# Patient Record
Sex: Male | Born: 1965 | Race: White | Hispanic: No | Smoking: Current every day smoker
Health system: Southern US, Community
[De-identification: ages and names within clinical notes are randomized; demographics above are authoritative.]

---

## 1998-08-13 ENCOUNTER — Emergency Department (HOSPITAL_COMMUNITY): Admission: EM | Admit: 1998-08-13 | Discharge: 1998-08-13 | Payer: Self-pay | Admitting: *Deleted

## 2000-10-30 ENCOUNTER — Ambulatory Visit (HOSPITAL_COMMUNITY): Admission: RE | Admit: 2000-10-30 | Discharge: 2000-10-30 | Payer: Self-pay | Admitting: Urology

## 2000-10-30 ENCOUNTER — Encounter: Payer: Self-pay | Admitting: Urology

## 2011-12-21 ENCOUNTER — Emergency Department (HOSPITAL_COMMUNITY)
Admission: EM | Admit: 2011-12-21 | Discharge: 2011-12-21 | Disposition: A | Discharge status: Home / Self Care | Payer: Self-pay | Attending: Emergency Medicine | Admitting: Emergency Medicine

## 2011-12-21 ENCOUNTER — Encounter (HOSPITAL_COMMUNITY): Payer: Self-pay | Admitting: *Deleted

## 2011-12-21 ENCOUNTER — Emergency Department (HOSPITAL_COMMUNITY): Payer: Self-pay

## 2011-12-21 DIAGNOSIS — M25569 Pain in unspecified knee: Secondary | ICD-10-CM | POA: Insufficient documentation

## 2011-12-21 DIAGNOSIS — M25519 Pain in unspecified shoulder: Secondary | ICD-10-CM | POA: Insufficient documentation

## 2011-12-21 DIAGNOSIS — F17200 Nicotine dependence, unspecified, uncomplicated: Secondary | ICD-10-CM | POA: Insufficient documentation

## 2011-12-21 DIAGNOSIS — S2239XA Fracture of one rib, unspecified side, initial encounter for closed fracture: Secondary | ICD-10-CM

## 2011-12-21 DIAGNOSIS — W11XXXA Fall on and from ladder, initial encounter: Secondary | ICD-10-CM | POA: Insufficient documentation

## 2011-12-21 DIAGNOSIS — R079 Chest pain, unspecified: Secondary | ICD-10-CM | POA: Insufficient documentation

## 2011-12-21 DIAGNOSIS — S2249XA Multiple fractures of ribs, unspecified side, initial encounter for closed fracture: Secondary | ICD-10-CM | POA: Insufficient documentation

## 2011-12-21 MED ORDER — OXYCODONE-ACETAMINOPHEN 5-325 MG PO TABS: 1.0000 | ORAL_TABLET | ORAL | Status: AC | PRN

## 2011-12-21 MED ORDER — MORPHINE SULFATE 10 MG/ML IJ SOLN
8.0000 mg | Freq: Once | INTRAMUSCULAR | Status: AC
  Administered 2011-12-21: 8 mg via INTRAVENOUS
  Filled 2011-12-21: qty 1

## 2011-12-21 MED ORDER — SODIUM CHLORIDE 0.9 % IV SOLN
Freq: Once | INTRAVENOUS | Status: AC
  Administered 2011-12-21: 12:00:00 via INTRAVENOUS

## 2011-12-21 MED ORDER — METHOCARBAMOL 500 MG PO TABS: ORAL_TABLET | ORAL | Status: DC

## 2011-12-21 MED ORDER — ONDANSETRON HCL 4 MG/2ML IJ SOLN
4.0000 mg | Freq: Once | INTRAMUSCULAR | Status: AC
  Administered 2011-12-21: 4 mg via INTRAVENOUS
  Filled 2011-12-21: qty 2

## 2011-12-21 NOTE — ED Notes (Signed)
Pt states that he fell off a ladder approximately 6 feet this am. States that he landed on a pile of bricks. C/o pain in his left shoulder, left ribs and left knee. Pt denies hitting his head or loss of consciousness.

## 2011-12-21 NOTE — ED Notes (Signed)
Cervical collar placed on patient

## 2012-01-14 NOTE — ED Provider Notes (Signed)
History     CSN: [id number]  Arrival date & time 12/21/11  1005   None     Chief Complaint  Patient presents with  . Fall    (Consider location/radiation/quality/duration/timing/severity/associated sxs/prior treatment) Patient is a 46 y.o. male presenting with fall. The history is provided by the patient.  Fall The accident occurred 3 to 5 hours ago. The fall occurred from a ladder. He fell from a height of 6 to 10 ft. Impact surface: Pile of bricks. The point of impact was the left shoulder. The pain is present in the left shoulder (Left ribs and knee). The pain is severe. He was ambulatory at the scene. Pertinent negatives include no visual change, no fever, no numbness, no abdominal pain, no vomiting, no hematuria and no loss of consciousness. The symptoms are aggravated by activity. He has tried nothing for the symptoms.    History reviewed. No pertinent past medical history.  History reviewed. No pertinent past surgical history.  History reviewed. No pertinent family history.  History  Substance Use Topics  . Smoking status: Current Everyday Smoker -- 1.0 packs/day    Types: Cigarettes  . Smokeless tobacco: Not on file  . Alcohol Use: Yes      Review of Systems  Constitutional: Negative for fever and activity change.       All ROS Neg except as noted in HPI  HENT: Negative for nosebleeds and neck pain.   Eyes: Negative for photophobia and discharge.  Respiratory: Negative for cough, shortness of breath and wheezing.        Chest wall pain  Cardiovascular: Negative for chest pain and palpitations.  Gastrointestinal: Negative for vomiting, abdominal pain and blood in stool.  Genitourinary: Negative for dysuria, frequency and hematuria.  Musculoskeletal: Negative for back pain and arthralgias.  Skin: Negative.   Neurological: Negative for dizziness, seizures, loss of consciousness, speech difficulty and numbness.  Psychiatric/Behavioral: Negative for hallucinations  and confusion.    Allergies  Review of patient's allergies indicates no known allergies.  Home Medications   Current Outpatient Rx  Name Route Sig Dispense Refill  . METHOCARBAMOL 500 MG PO TABS  2 po tid for spasm around ribs 30 tablet 0    BP 115/81  Pulse 72  Temp(Src) 97.5 F (36.4 C) (Oral)  Resp 18  Ht 6\' 2"  (1.88 m)  Wt 200 lb (90.719 kg)  BMI 25.68 kg/m2  SpO2 98%  Physical Exam  Nursing note and vitals reviewed. Constitutional: He is oriented to person, place, and time. He appears well-developed and well-nourished.  Non-toxic appearance.  HENT:  Head: Normocephalic.  Right Ear: Tympanic membrane and external ear normal.  Left Ear: Tympanic membrane and external ear normal.  Eyes: EOM and lids are normal. Pupils are equal, round, and reactive to light.  Neck: Normal range of motion. Neck supple. Carotid bruit is not present.  Cardiovascular: Normal rate, regular rhythm, normal heart sounds, intact distal pulses and normal pulses.   Pulmonary/Chest: Breath sounds normal. No respiratory distress.       Pain to palpation of the left ribs. No palpable deformity. No crepitus noted.  Abdominal: Soft. Bowel sounds are normal. There is no tenderness. There is no guarding.  Musculoskeletal: Normal range of motion.       Soreness with range of motion of the left knee. No deformity of the left hip. No deformity of the left lower leg. Distal pulses symmetrical.  Lymphadenopathy:       Head (right side): No  submandibular adenopathy present.       Head (left side): No submandibular adenopathy present.    He has no cervical adenopathy.  Neurological: He is alert and oriented to person, place, and time. He has normal strength. No cranial nerve deficit or sensory deficit.  Skin: Skin is warm and dry.  Psychiatric: He has a normal mood and affect. His speech is normal.    ED Course  Procedures (including critical care time)  Labs Reviewed - No data to display No results  found.   1. Rib fractures       MDM  I have reviewed nursing notes, vital signs, and all appropriate lab and imaging results for this patient.        Kathie Dike, [place] 01/14/12 2240

## 2012-01-15 NOTE — ED Provider Notes (Signed)
Medical screening examination/treatment/procedure(s) were performed by non-physician practitioner and as supervising physician I was immediately available for consultation/collaboration. Cassidie Veiga, MD, FACEP   Avynn Klassen L Aiza Vollrath, MD 01/15/12 2007 

## 2013-01-10 IMAGING — CR DG CERVICAL SPINE COMPLETE 4+V
6 series · 6 of 6 positions shown · non-contrast
Comparison: None.

CLINICAL DATA: Fall from ladder, left-sided pain

CERVICAL SPINE - COMPLETE 4+ VIEW

[view not recorded (1 of 6)]
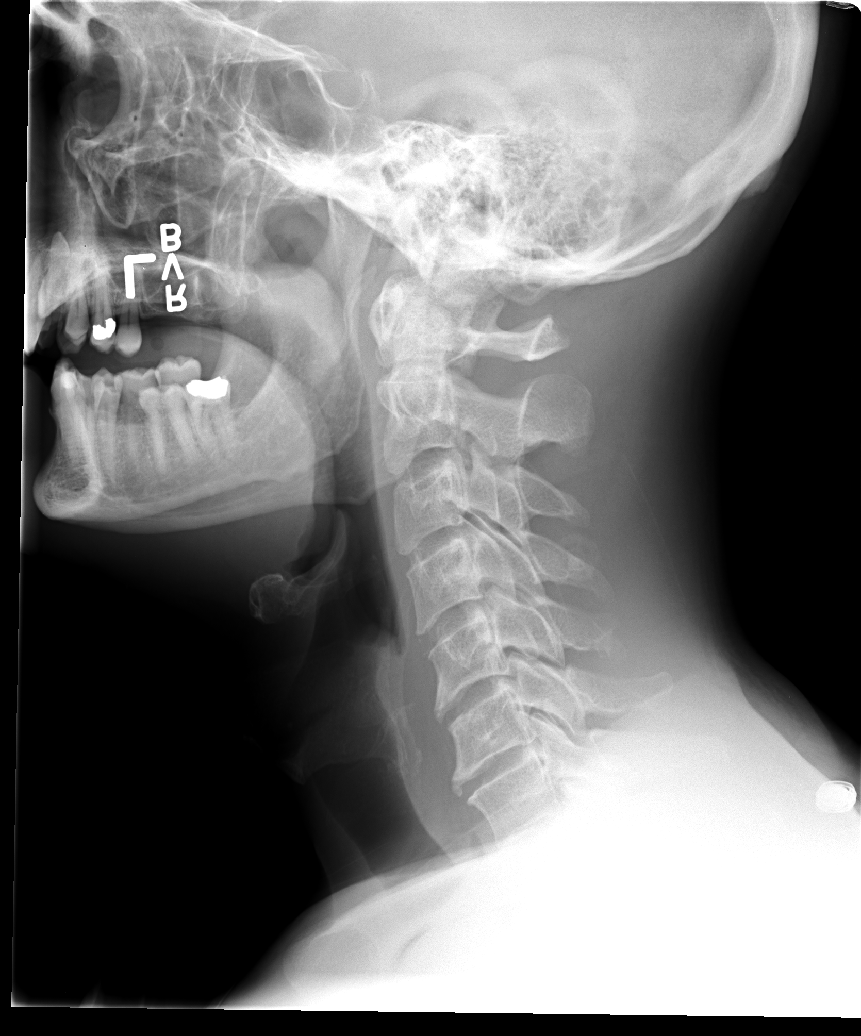

[view not recorded (2 of 6)]
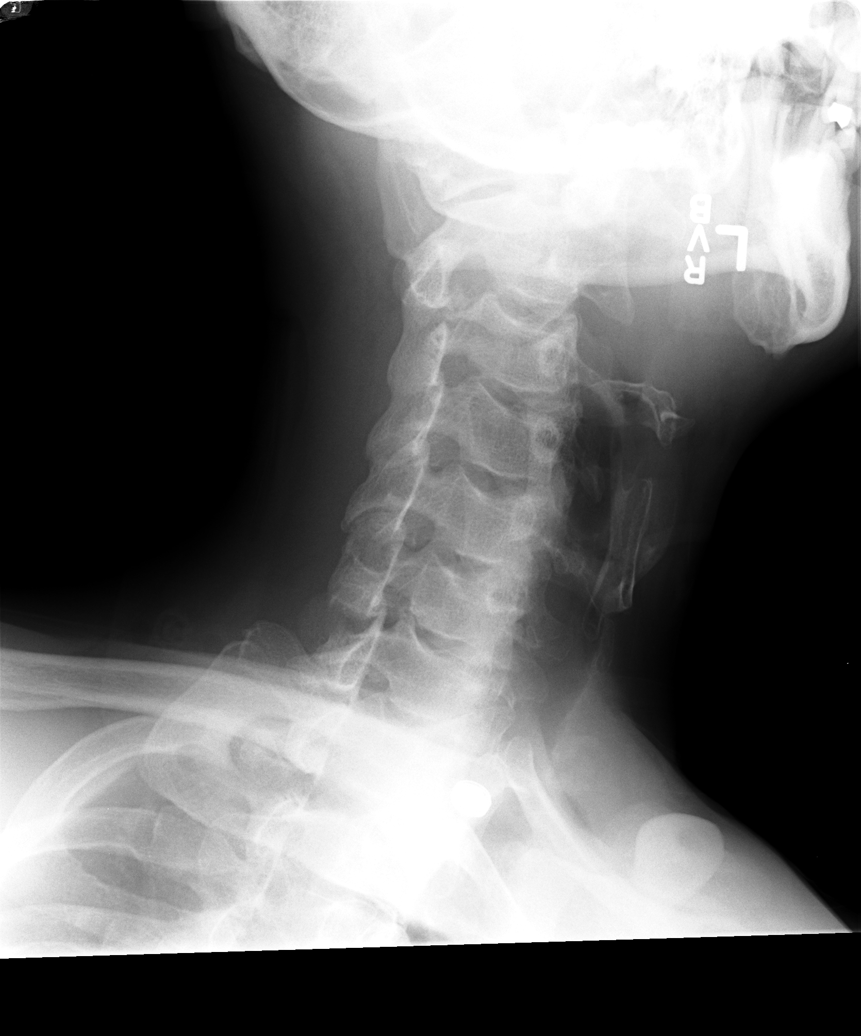

[view not recorded (3 of 6)]
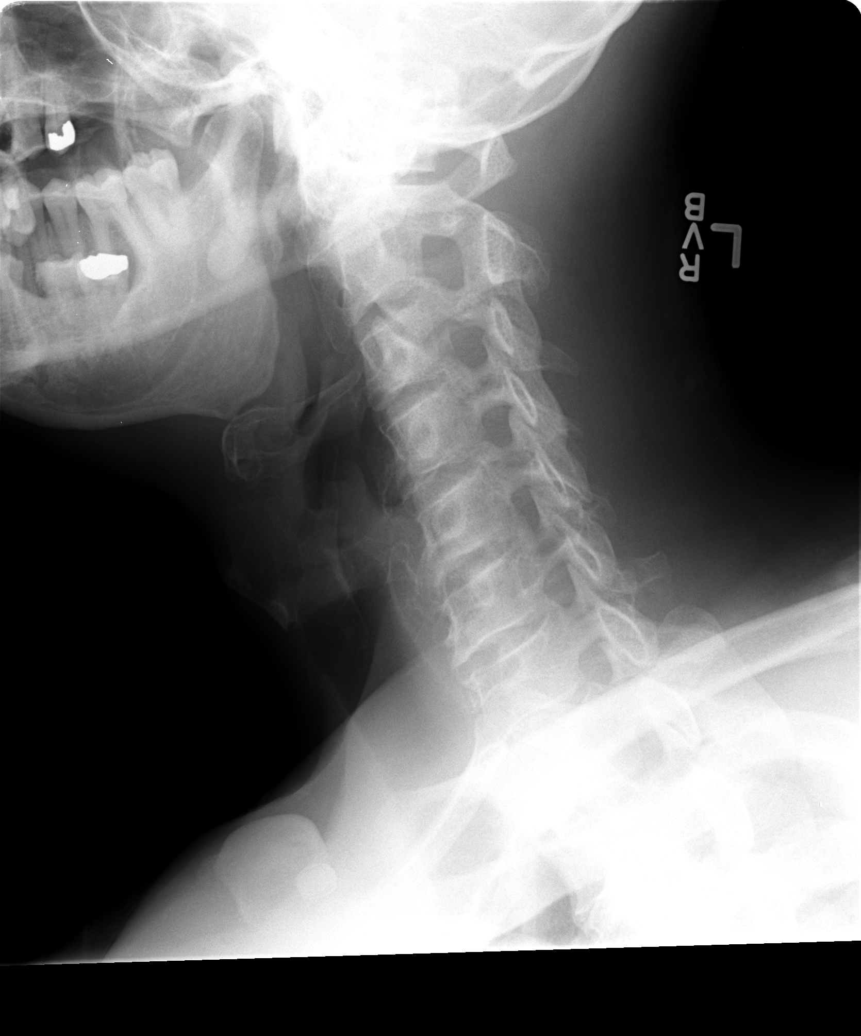

[view not recorded (4 of 6)]
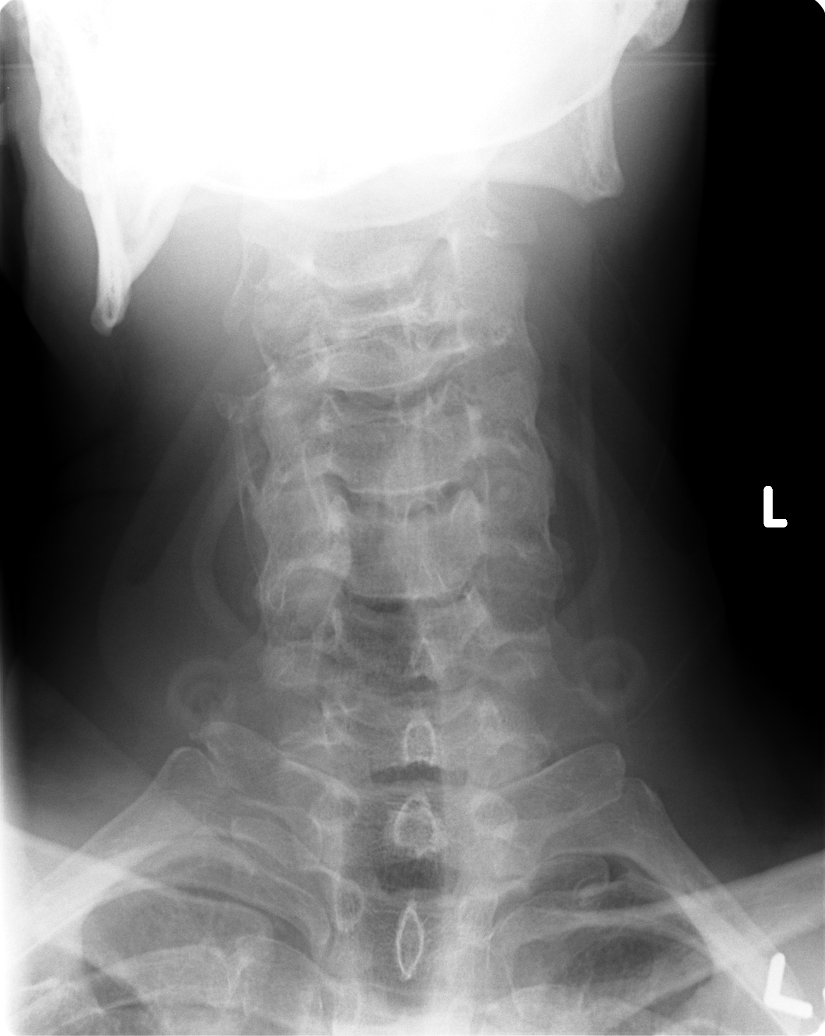

[view not recorded (5 of 6)]
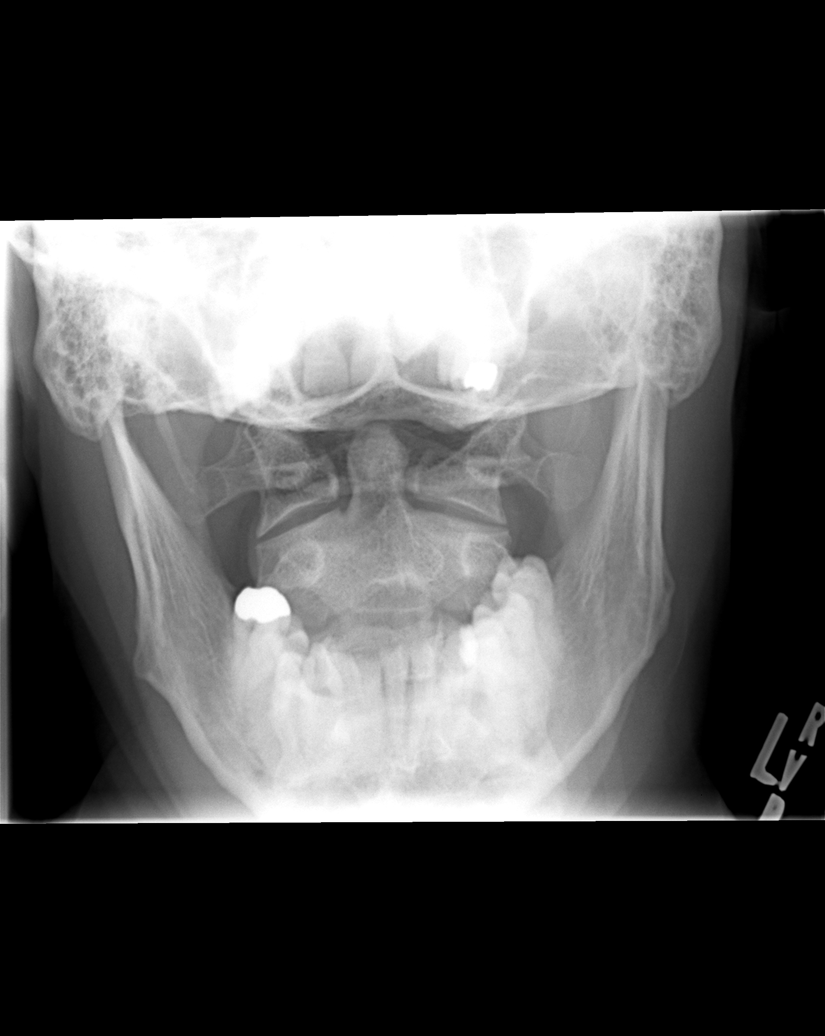

[view not recorded (6 of 6)]
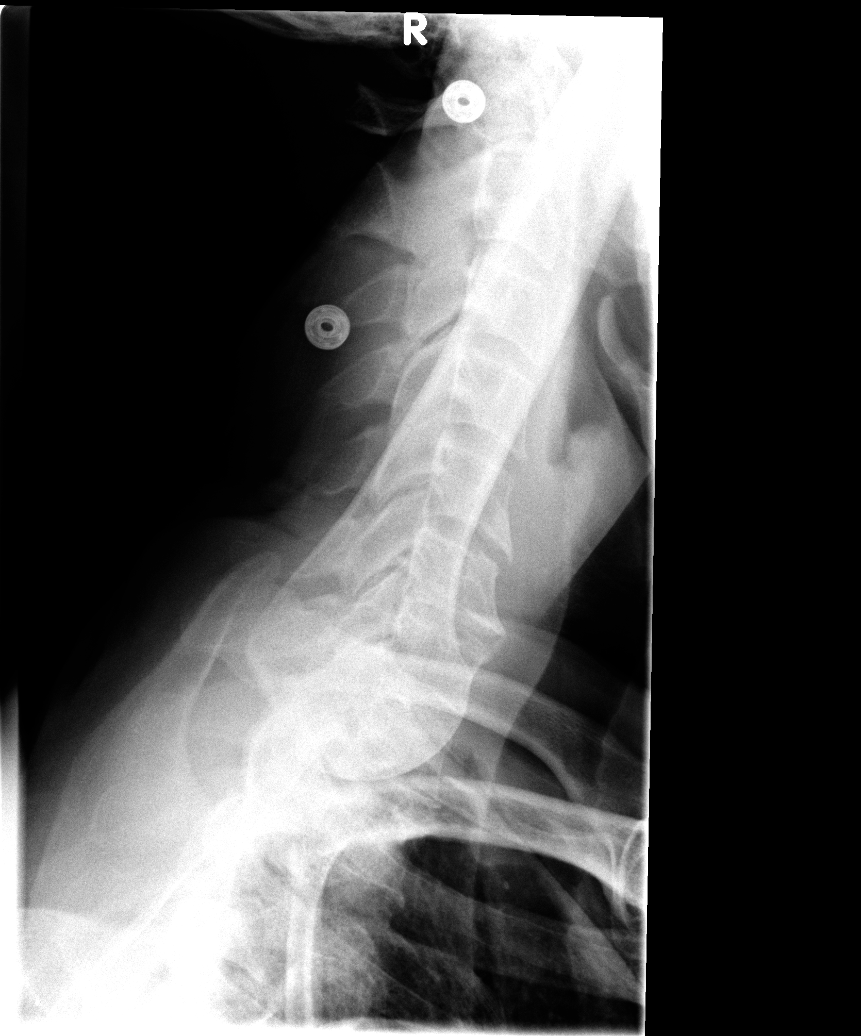

[6 of 6 positions shown; findings below may reference images not displayed]

FINDINGS: C1 through the cervical thoracic junction is visualized
in its entirety. No precervical soft tissue widening is present.
The dens is intact and well situated between the lateral masses.
Mild C6-7 anterior disc degenerative change noted.  Neural foramina
are patent bilaterally.
IMPRESSION: No acute finding.

## 2019-02-02 ENCOUNTER — Other Ambulatory Visit: Payer: Self-pay

## 2019-02-02 ENCOUNTER — Inpatient Hospital Stay (HOSPITAL_COMMUNITY)
Admission: EM | Admit: 2019-02-02 | Discharge: 2019-02-03 | DRG: 558 | Discharge status: Against Medical Advice | Payer: Self-pay | Attending: Internal Medicine | Admitting: Internal Medicine

## 2019-02-02 ENCOUNTER — Encounter (HOSPITAL_COMMUNITY): Payer: Self-pay | Admitting: Emergency Medicine

## 2019-02-02 DIAGNOSIS — F1590 Other stimulant use, unspecified, uncomplicated: Secondary | ICD-10-CM | POA: Diagnosis present

## 2019-02-02 DIAGNOSIS — M6282 Rhabdomyolysis: Principal | ICD-10-CM | POA: Diagnosis present

## 2019-02-02 DIAGNOSIS — F17210 Nicotine dependence, cigarettes, uncomplicated: Secondary | ICD-10-CM | POA: Diagnosis present

## 2019-02-02 DIAGNOSIS — F1910 Other psychoactive substance abuse, uncomplicated: Secondary | ICD-10-CM | POA: Diagnosis present

## 2019-02-02 DIAGNOSIS — R740 Nonspecific elevation of levels of transaminase and lactic acid dehydrogenase [LDH]: Secondary | ICD-10-CM | POA: Diagnosis present

## 2019-02-02 LAB — COMPREHENSIVE METABOLIC PANEL
ALK PHOS: 75 U/L (ref 38–126)
ALT: 87 U/L — AB (ref 0–44)
AST: 169 U/L — AB (ref 15–41)
Albumin: 4.2 g/dL (ref 3.5–5.0)
Anion gap: 9 (ref 5–15)
BUN: 21 mg/dL — AB (ref 6–20)
CHLORIDE: 101 mmol/L (ref 98–111)
CO2: 22 mmol/L (ref 22–32)
CREATININE: 0.9 mg/dL (ref 0.61–1.24)
Calcium: 8.6 mg/dL — ABNORMAL LOW (ref 8.9–10.3)
GFR calc Af Amer: >60 mL/min (ref >60)
Glucose, Bld: 96 mg/dL (ref 70–99)
Potassium: 3.5 mmol/L (ref 3.5–5.1)
SODIUM: 132 mmol/L — AB (ref 135–145)
Total Bilirubin: 1.4 mg/dL — ABNORMAL HIGH (ref 0.3–1.2)
Total Protein: 7.6 g/dL (ref 6.5–8.1)

## 2019-02-02 LAB — CBC WITH DIFFERENTIAL/PLATELET
ABS IMMATURE GRANULOCYTES: 0.01 10*3/uL (ref 0.00–0.07)
Basophils Absolute: 0 10*3/uL (ref 0.0–0.1)
Basophils Relative: 0 %
Eosinophils Absolute: 0.1 10*3/uL (ref 0.0–0.5)
Eosinophils Relative: 1 %
HEMATOCRIT: 45.3 % (ref 39.0–52.0)
HEMOGLOBIN: 14.3 g/dL (ref 13.0–17.0)
Immature Granulocytes: 0 %
LYMPHS PCT: 23 %
Lymphs Abs: 1.3 10*3/uL (ref 0.7–4.0)
MCH: 30.1 pg (ref 26.0–34.0)
MCHC: 31.6 g/dL (ref 30.0–36.0)
MCV: 95.4 fL (ref 80.0–100.0)
MONOS PCT: 11 %
Monocytes Absolute: 0.6 10*3/uL (ref 0.1–1.0)
NEUTROS ABS: 3.6 10*3/uL (ref 1.7–7.7)
Neutrophils Relative %: 65 %
Platelets: 243 10*3/uL (ref 150–400)
RBC: 4.75 MIL/uL (ref 4.22–5.81)
RDW: 13.5 % (ref 11.5–15.5)
WBC: 5.5 10*3/uL (ref 4.0–10.5)
nRBC: 0 % (ref 0.0–0.2)

## 2019-02-02 LAB — ETHANOL: Alcohol, Ethyl (B): <10 mg/dL (ref <10)

## 2019-02-02 LAB — CK: Total CK: 7786 U/L — ABNORMAL HIGH (ref 49–397)

## 2019-02-02 MED ORDER — SODIUM CHLORIDE 0.9 % IV BOLUS
1000.0000 mL | Freq: Once | INTRAVENOUS | Status: AC
Start: 2019-02-02 — End: 2019-02-03
  Administered 2019-02-02: 1000 mL via INTRAVENOUS

## 2019-02-02 MED ORDER — SODIUM CHLORIDE 0.9 % IV BOLUS
1000.0000 mL | Freq: Once | INTRAVENOUS | Status: AC
  Administered 2019-02-03: 1000 mL via INTRAVENOUS

## 2019-02-02 MED ORDER — CYCLOBENZAPRINE HCL 10 MG PO TABS
10.0000 mg | ORAL_TABLET | Freq: Once | ORAL | Status: AC
  Administered 2019-02-02: 10 mg via ORAL
  Filled 2019-02-02: qty 1

## 2019-02-02 NOTE — ED Notes (Signed)
EKG done and seen by Dr Jacqulyn Bath.

## 2019-02-02 NOTE — ED Provider Notes (Signed)
Emergency Department Provider Note   I have reviewed the triage vital signs and the nursing notes.   HISTORY  Chief Complaint Medical Clearance   HPI Anthony Callahan is a 53 y.o. male with PMH of polysubstance abuse presents to the emergency department for evaluation of detox from methamphetamine.  Patient states he uses frequently but not daily but has used over the past 2 years.  He occasionally drinks alcohol and uses marijuana.  He does not drink regularly or have history of complicated withdrawal.  He does not regularly use benzodiazepines.  He states that he is having diffuse muscle cramps especially behind his legs.  Last night he used meth and got a verbal altercation with someone.  He went running to the woods and got several scratches from plants and bushes.  Patient tried detox years ago but states he did not really work for him but is more committed at this time.    History reviewed. No pertinent past medical history.  Patient Active Problem List   Diagnosis Date Noted  . Rhabdomyolysis 02/03/2019    History reviewed. No pertinent surgical history.  Allergies Patient has no known allergies.  History reviewed. No pertinent family history.  Social History Social History   Tobacco Use  . Smoking status: Current Every Day Smoker    Packs/day: 1.00    Types: Cigarettes  Substance Use Topics  . Alcohol use: Yes    Comment: 6 pk/day  . Drug use: Yes    Types: Methamphetamines    Comment: snorts meth every couple days. last use was last night    Review of Systems  Constitutional: No fever/chills Eyes: No visual changes. ENT: No sore throat. Cardiovascular: Denies chest pain. Respiratory: Denies shortness of breath. Gastrointestinal: No abdominal pain.  No nausea, no vomiting.  No diarrhea.  No constipation. Genitourinary: Negative for dysuria. Musculoskeletal: Negative for back pain. Positive diffuse muscle cramps.  Skin: Negative for  rash. Neurological: Negative for headaches, focal weakness or numbness.  10-point ROS otherwise negative.  ____________________________________________   PHYSICAL EXAM:  VITAL SIGNS: ED Triage Vitals  Enc Vitals Group     BP 02/02/19 2125 (!) 136/101     Pulse Rate 02/02/19 2125 (!) 117     Resp 02/02/19 2125 (!) 22     Temp 02/02/19 2125 (!) 97.4 F (36.3 C)     Temp Source 02/02/19 2125 Oral     SpO2 02/02/19 2125 98 %     Weight 02/02/19 2125 180 lb (81.6 kg)     Height 02/02/19 2125 6\' 2"  (1.88 m)     Pain Score 02/02/19 2124 0   Constitutional: Alert and oriented. Well appearing and in no acute distress. Eyes: Conjunctivae are normal. PERRL. Head: Atraumatic. Nose: No congestion/rhinnorhea. Mouth/Throat: Mucous membranes are moist.  Neck: No stridor.  Cardiovascular: Normal rate, regular rhythm. Good peripheral circulation. Grossly normal heart sounds.   Respiratory: Normal respiratory effort.  No retractions. Lungs CTAB. Gastrointestinal: Soft and nontender. No distention.  Musculoskeletal: No lower extremity tenderness nor edema. No gross deformities of extremities. Neurologic:  Normal speech and language. No gross focal neurologic deficits are appreciated.  Skin:  Skin is warm and dry. Several small scratches over the arms/legs. No lacerations or ulcerations. No cellulitis or abscess.    ____________________________________________   LABS (all labs ordered are listed, but only abnormal results are displayed)  Labs Reviewed  COMPREHENSIVE METABOLIC PANEL - Abnormal; Notable for the following components:  Result Value   Sodium 132 (*)    BUN 21 (*)    Calcium 8.6 (*)    AST 169 (*)    ALT 87 (*)    Total Bilirubin 1.4 (*)    All other components within normal limits  CK - Abnormal; Notable for the following components:   Total CK 7,786 (*)    All other components within normal limits  COMPREHENSIVE METABOLIC PANEL - Abnormal; Notable for the following  components:   CO2 21 (*)    Calcium 8.1 (*)    Total Protein 6.2 (*)    Albumin 3.4 (*)    AST 137 (*)    ALT 72 (*)    All other components within normal limits  CK - Abnormal; Notable for the following components:   Total CK 4,719 (*)    All other components within normal limits  ETHANOL  CBC WITH DIFFERENTIAL/PLATELET  CBC  HIV ANTIBODY (ROUTINE TESTING W REFLEX)   ____________________________________________  EKG   EKG Interpretation  Date/Time:  Wednesday February 02 2019 22:31:42 EST Ventricular Rate:  95 PR Interval:    QRS Duration: 83 QT Interval:  348 QTC Calculation: 438 R Axis:   70 Text Interpretation:  Sinus rhythm No STEMI.  Confirmed by Alona Bene [phone]) on 02/02/2019 11:01:11 PM       ____________________________________________  RADIOLOGY  None  ____________________________________________   PROCEDURES  Procedure(s) performed:   Procedures  None  ____________________________________________   INITIAL IMPRESSION / ASSESSMENT AND PLAN / ED COURSE  Pertinent labs & imaging results that were available during my care of the patient were reviewed by me and considered in my medical decision making (see chart for details).  Patient presents to the emergency department requesting detox from methamphetamine.  He denies suicidal or homicidal ideation.  I discussed that we do not do inpatient detox for methamphetamine but I cannot provide him with resources.  He is also complaining of diffuse muscle cramping.  Given his drug use I suspect possible dehydration.  He is tachycardic here without fever.  No concern for sepsis.  Plan for IV fluids and Flexeril.  We will check baseline labs including CK.   CK elevated > 7,000. Plan for additional IVF. Discussed overnight admit with the patient for CK trending and IVF. Patient in agreement. Understands that not treating this appropriately could lead to renal failure which was discussed in detail.   Discussed  patient's case with Hospitalist, Dr. Sharl Ma to request admission. Patient and family (if present) updated with plan. Care transferred to Hospitalist service.  I reviewed all nursing notes, vitals, pertinent old records, EKGs, labs, imaging (as available).  ____________________________________________  FINAL CLINICAL IMPRESSION(S) / ED DIAGNOSES  Final diagnoses:  Non-traumatic rhabdomyolysis     MEDICATIONS GIVEN DURING THIS VISIT:  Medications  enoxaparin (LOVENOX) injection 40 mg (40 mg Subcutaneous Given 02/03/19 0246)  acetaminophen (TYLENOL) tablet 650 mg (has no administration in time range)    Or  acetaminophen (TYLENOL) suppository 650 mg (has no administration in time range)  ondansetron (ZOFRAN) tablet 4 mg (has no administration in time range)    Or  ondansetron (ZOFRAN) injection 4 mg (has no administration in time range)  0.9 %  sodium chloride infusion ( Intravenous Stopped 02/03/19 0753)  sodium chloride 0.9 % bolus 1,000 mL (0 mLs Intravenous Stopped 02/03/19 0001)  cyclobenzaprine (FLEXERIL) tablet 10 mg (10 mg Oral Given 02/02/19 2218)  sodium chloride 0.9 % bolus 1,000 mL (0 mLs Intravenous Stopped  02/03/19 0241)    Note:  This document was prepared using Dragon voice recognition software and may include unintentional dictation errors.  Alona Bene, MD Emergency Medicine    , Arlyss Repress, MD 02/03/19 [phone]

## 2019-02-02 NOTE — ED Triage Notes (Signed)
Pt states he wants detox from meth. Denies SI/HI just wants help to get off meth. Last use was last night.

## 2019-02-03 DIAGNOSIS — M6282 Rhabdomyolysis: Principal | ICD-10-CM

## 2019-02-03 LAB — COMPREHENSIVE METABOLIC PANEL
ALT: 72 U/L — ABNORMAL HIGH (ref 0–44)
AST: 137 U/L — AB (ref 15–41)
Albumin: 3.4 g/dL — ABNORMAL LOW (ref 3.5–5.0)
Alkaline Phosphatase: 60 U/L (ref 38–126)
Anion gap: 5 (ref 5–15)
BUN: 18 mg/dL (ref 6–20)
CO2: 21 mmol/L — ABNORMAL LOW (ref 22–32)
Calcium: 8.1 mg/dL — ABNORMAL LOW (ref 8.9–10.3)
Chloride: 111 mmol/L (ref 98–111)
Creatinine, Ser: 0.83 mg/dL (ref 0.61–1.24)
GFR calc Af Amer: >60 mL/min (ref >60)
GFR calc non Af Amer: >60 mL/min (ref >60)
Glucose, Bld: 99 mg/dL (ref 70–99)
Potassium: 3.6 mmol/L (ref 3.5–5.1)
Sodium: 137 mmol/L (ref 135–145)
Total Bilirubin: 1.1 mg/dL (ref 0.3–1.2)
Total Protein: 6.2 g/dL — ABNORMAL LOW (ref 6.5–8.1)

## 2019-02-03 LAB — CBC
HCT: 43.3 % (ref 39.0–52.0)
Hemoglobin: 13.7 g/dL (ref 13.0–17.0)
MCH: 31.2 pg (ref 26.0–34.0)
MCHC: 31.6 g/dL (ref 30.0–36.0)
MCV: 98.6 fL (ref 80.0–100.0)
Platelets: 201 10*3/uL (ref 150–400)
RBC: 4.39 MIL/uL (ref 4.22–5.81)
RDW: 13.6 % (ref 11.5–15.5)
WBC: 4.5 10*3/uL (ref 4.0–10.5)
nRBC: 0 % (ref 0.0–0.2)

## 2019-02-03 LAB — CK: Total CK: 4719 U/L — ABNORMAL HIGH (ref 49–397)

## 2019-02-03 MED ORDER — ENOXAPARIN SODIUM 40 MG/0.4ML ~~LOC~~ SOLN
40.0000 mg | SUBCUTANEOUS | Status: DC
  Administered 2019-02-03: 40 mg via SUBCUTANEOUS
  Filled 2019-02-03: qty 0.4

## 2019-02-03 MED ORDER — ONDANSETRON HCL 4 MG PO TABS: 4.0000 mg | ORAL_TABLET | Freq: Four times a day (QID) | ORAL | Status: DC | PRN

## 2019-02-03 MED ORDER — SODIUM CHLORIDE 0.9 % IV SOLN
INTRAVENOUS | Status: DC
  Administered 2019-02-03: 03:00:00 via INTRAVENOUS

## 2019-02-03 MED ORDER — ONDANSETRON HCL 4 MG/2ML IJ SOLN: 4.0000 mg | Freq: Four times a day (QID) | INTRAMUSCULAR | Status: DC | PRN

## 2019-02-03 MED ORDER — ACETAMINOPHEN 325 MG PO TABS: 650.0000 mg | ORAL_TABLET | Freq: Four times a day (QID) | ORAL | Status: DC | PRN

## 2019-02-03 MED ORDER — ACETAMINOPHEN 650 MG RE SUPP: 650.0000 mg | Freq: Four times a day (QID) | RECTAL | Status: DC | PRN

## 2019-02-03 MED ORDER — SODIUM CHLORIDE 0.9 % IV SOLN
INTRAVENOUS | Status: DC
  Administered 2019-02-03: 150 mL/h via INTRAVENOUS

## 2019-02-03 NOTE — Discharge Summary (Signed)
[facility] Discharge Summary   Patient: Anthony Callahan WCB:[id number]   PCP: Patient, No Pcp Per DOB: 20-Dec-1965   Date of admission: 02/02/2019   Date of discharge: 02/03/2019    Discharge Diagnoses:  Active Problems:   Rhabdomyolysis   Discharge Condition: LEFT AMA  History of present illness: As per the H and P dictated on admission, "[name]  is a 53 y.o. male, with history of polysubstance abuse, has been using methamphetamine almost every day came for evaluation for detox.  Patient said he has been using methamphetamine almost daily for past 2 years.  Occasionally drinks alcohol.  Patient has been experiencing muscle cramps for past few days.  Last night patient used meth and got a verbal altercation with someone.  He went running through the woods and got several scratches from plants and bushes. In the ED patient was found to have elevated CPK 7,786."  Hospital Course:  Presented with request for detox. Pt has rhabdomyolysis. Started on IV fluids and admitted to the hospital.  Pt informed RN that he has to go to work and left AMA. Pt left the building before I was notified.  patient left AMA  Procedures and Results:  none   Consultations:  none  The results of significant diagnostics from this hospitalization (including imaging, microbiology, ancillary and laboratory) are listed below for reference.    Significant Diagnostic Studies: No results found.  Microbiology: No results found for this or any previous visit (from the past 240 hour(s)).   Labs: CBC: Recent Labs  Lab 02/02/19 2230 02/03/19 0530  WBC 5.5 4.5  NEUTROABS 3.6  --   HGB 14.3 13.7  HCT 45.3 43.3  MCV 95.4 98.6  PLT 243 201   Basic Metabolic Panel: Recent Labs  Lab 02/02/19 2230 02/03/19 0530  NA 132* 137  K 3.5 3.6  CL 101 111  CO2 22 21*  GLUCOSE 96 99  BUN 21* 18  CREATININE 0.90 0.83  CALCIUM 8.6* 8.1*   Liver Function Tests: Recent Labs  Lab 02/02/19 2230  02/03/19 0530  AST 169* 137*  ALT 87* 72*  ALKPHOS 75 60  BILITOT 1.4* 1.1  PROT 7.6 6.2*  ALBUMIN 4.2 3.4*   No results for input(s): LIPASE, AMYLASE in the last 168 hours. No results for input(s): AMMONIA in the last 168 hours. Cardiac Enzymes: Recent Labs  Lab 02/02/19 2230 02/03/19 0530  CKTOTAL 7,786* 4,719*   BNP (last 3 results) No results for input(s): BNP in the last 8760 hours. CBG: No results for input(s): GLUCAP in the last 168 hours. Time spent: 15 minutes  Signed:  Lynden Oxford  [facility] 02/03/2019,

## 2019-02-03 NOTE — ED Notes (Signed)
Pt given sandwich upon request 

## 2019-02-03 NOTE — ED Notes (Signed)
Pt left AMA.  States he needed to go to work and could not miss a day.  Unwilling to stay and speak to the MD

## 2019-02-03 NOTE — H&P (Signed)
[facility] H&P    Patient Demographics:    Anthony Callahan, is a 53 y.o. male  MRN: [id number]  DOB - 13-Aug-1966  Admit Date - 02/02/2019  Referring MD/NP/PA: Alona Bene  Outpatient Primary MD for the patient is Patient, No Pcp Per  Patient coming from: Home  Chief complaint-body aches   HPI:    Anthony Callahan  is a 53 y.o. male, with history of polysubstance abuse, has been using methamphetamine almost every day came for evaluation for detox.  Patient said he has been using methamphetamine almost daily for past 2 years.  Occasionally drinks alcohol.  Patient has been experiencing muscle cramps for past few days.  Last night patient used meth and got a verbal altercation with someone.  He went running through the woods and got several scratches from plants and bushes. In the ED patient was found to have elevated CPK 7,786.  He denies nausea vomiting or diarrhea. Denies chest pain or shortness of breath Denies abdominal pain Denies dysuria. No previous history of stroke or seizures.   Review of systems:    In addition to the HPI above,    All other systems reviewed and are negative.    Past History of the following :    No significant past medical history.   Social History:      Social History   Tobacco Use  . Smoking status: Current Every Day Smoker    Packs/day: 1.00    Types: Cigarettes  Substance Use Topics  . Alcohol use: Yes    Comment: 6 pk/day       Family History :   No family history of cancer   Home Medications:   Prior to Admission medications   Medication Sig Start Date End Date Taking? Authorizing Provider  methocarbamol (ROBAXIN) 500 MG tablet 2 po tid for spasm around ribs 12/21/11   Ivery Quale, PA-C     Allergies:    No Known Allergies   Physical Exam:   Vitals  Blood pressure 120/82, pulse 85, temperature (!) 97.4 F (36.3 C), temperature source Oral,  resp. rate (!) 26, height 6\' 2"  (1.88 m), weight 81.6 kg, SpO2 100 %.  1.  General: Appears in no acute distress  2. Psychiatric: Alert, oriented x3, no focal deficit noted  3. Neurologic: Cranial nerves II through XII grossly intact, no focal deficit noted  4. HEENMT:  Atraumatic, normocephalic  5. Respiratory : Clear to auscultation bilaterally  6. Cardiovascular : S1-S2, regular, no murmur auscultated  7. Gastrointestinal:  Abdomen is soft, nontender, no organomegaly     Data Review:    CBC Recent Labs  Lab 02/02/19 2230  WBC 5.5  HGB 14.3  HCT 45.3  PLT 243  MCV 95.4  MCH 30.1  MCHC 31.6  RDW 13.5  LYMPHSABS 1.3  MONOABS 0.6  EOSABS 0.1  BASOSABS 0.0   ------------------------------------------------------------------------------------------------------------------  Results for orders placed or performed during the hospital encounter of 02/02/19 (from the past 48 hour(s))  Comprehensive metabolic panel     Status: Abnormal  Collection Time: 02/02/19 10:30 PM  Result Value Ref Range   Sodium 132 (L) 135 - 145 mmol/L   Potassium 3.5 3.5 - 5.1 mmol/L   Chloride 101 98 - 111 mmol/L   CO2 22 22 - 32 mmol/L   Glucose, Bld 96 70 - 99 mg/dL   BUN 21 (H) 6 - 20 mg/dL   Creatinine, Ser 5.00 0.61 - 1.24 mg/dL   Calcium 8.6 (L) 8.9 - 10.3 mg/dL   Total Protein 7.6 6.5 - 8.1 g/dL   Albumin 4.2 3.5 - 5.0 g/dL   AST 938 (H) 15 - 41 U/L   ALT 87 (H) 0 - 44 U/L   Alkaline Phosphatase 75 38 - 126 U/L   Total Bilirubin 1.4 (H) 0.3 - 1.2 mg/dL   GFR calc non Af Amer >60 >60 mL/min   GFR calc Af Amer >60 >60 mL/min   Anion gap 9 5 - 15    Comment: Performed at [facility], [address], [place], [place] [address]  Ethanol     Status: None   Collection Time: 02/02/19 10:30 PM  Result Value Ref Range   Alcohol, Ethyl (B) <10 <10 mg/dL    Comment: (NOTE) Lowest detectable limit for serum alcohol is 10 mg/dL. For medical purposes only. Performed at [facility], [address] [place], [place] [address]   CBC with Differential     Status: None   Collection Time: 02/02/19 10:30 PM  Result Value Ref Range   WBC 5.5 4.0 - 10.5 K/uL   RBC 4.75 4.22 - 5.81 MIL/uL   Hemoglobin 14.3 13.0 - 17.0 g/dL   HCT 67.8 93.8 - 10.1 %   MCV 95.4 80.0 - 100.0 fL   MCH 30.1 26.0 - 34.0 pg   MCHC 31.6 30.0 - 36.0 g/dL   RDW 75.1 02.5 - 85.2 %   Platelets 243 150 - 400 K/uL   nRBC 0.0 0.0 - 0.2 %   Neutrophils Relative % 65 %   Neutro Abs 3.6 1.7 - 7.7 K/uL   Lymphocytes Relative 23 %   Lymphs Abs 1.3 0.7 - 4.0 K/uL   Monocytes Relative 11 %   Monocytes Absolute 0.6 0.1 - 1.0 K/uL   Eosinophils Relative 1 %   Eosinophils Absolute 0.1 0.0 - 0.5 K/uL   Basophils Relative 0 %   Basophils Absolute 0.0 0.0 - 0.1 K/uL   Immature Granulocytes 0 %   Abs Immature Granulocytes 0.01 0.00 - 0.07 K/uL    Comment: Performed at [facility], [address] [place], [place] [address]  CK     Status: Abnormal   Collection Time: 02/02/19 10:30 PM  Result Value Ref Range   Total CK 7,786 (H) 49 - 397 U/L    Comment: REPEATED TO VERIFY RESULTS CONFIRMED BY MANUAL DILUTION Performed at [facility], [address], [place], [place] [address]     Chemistries  Recent Labs  Lab 02/02/19 2230  NA 132*  K 3.5  CL 101  CO2 22  GLUCOSE 96  BUN 21*  CREATININE 0.90  CALCIUM 8.6*  AST 169*  ALT 87*  ALKPHOS 75  BILITOT 1.4*   ------------------------------------------------------------------------------------------------------------------  ------------------------------------------------------------------------------------------------------------------ GFR: Estimated Creatinine Clearance: 113.3 mL/min (by C-G formula based on SCr of 0.9 mg/dL). Liver Function Tests: Recent Labs  Lab 02/02/19 2230  AST 169*  ALT 87*  ALKPHOS 75  BILITOT 1.4*  PROT 7.6  ALBUMIN 4.2   No results for input(s): LIPASE, AMYLASE in  the last 168 hours. No results for input(s):  AMMONIA in the last 168 hours. Coagulation Profile: No results for input(s): INR, PROTIME in the last 168 hours. Cardiac Enzymes: Recent Labs  Lab 02/02/19 2230  CKTOTAL 7,786*   BNP (last 3 results) No results for input(s): PROBNP in the last 8760 hours. HbA1C: No results for input(s): HGBA1C in the last 72 hours. CBG: No results for input(s): GLUCAP in the last 168 hours. Lipid Profile: No results for input(s): CHOL, HDL, LDLCALC, TRIG, CHOLHDL, LDLDIRECT in the last 72 hours. Thyroid Function Tests: No results for input(s): TSH, T4TOTAL, FREET4, T3FREE, THYROIDAB in the last 72 hours. Anemia Panel: No results for input(s): VITAMINB12, FOLATE, FERRITIN, TIBC, IRON, RETICCTPCT in the last 72 hours.  --------------------------------------------------------------------------------------------------------------- Urine analysis: No results found for: COLORURINE, APPEARANCEUR, LABSPEC, PHURINE, GLUCOSEU, HGBUR, BILIRUBINUR, KETONESUR, PROTEINUR, UROBILINOGEN, NITRITE, LEUKOCYTESUR    Imaging Results:    No results found.  My personal review of EKG: Rhythm NSR   Assessment & Plan:    Active Problems:   Rhabdomyolysis   1. Rhabdomyolysis-secondary to ingestion of methamphetamine, patient received 2 L of IV normal saline in the ED.  We will continue with normal saline at 150 mL/h.  Check CK level in a.m.  2. Transaminitis-patient has mild elevation of AST and ALT, likely from rhabdomyolysis.  Follow liver enzymes in a.m.  3. 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  Will consult social work for outpatient substance abuse program information.   DVT Prophylaxis-   Lovenox   AM Labs Ordered, also please review Full Orders  Family Communication: Admission, patients condition and plan of care including tests being ordered have been discussed with the patient  who indicate understanding and agree with the plan and Code Status.  Code Status: Full  code  Admission status: Inpatient: Based on patients clinical presentation and evaluation of above clinical data, I have made determination that patient meets Inpatient criteria at this time.  Time spent in minutes : 60 minutes   Meredeth Ide M.D on 02/03/2019 at 2:26 AM

## 2019-02-04 ENCOUNTER — Encounter (HOSPITAL_COMMUNITY): Payer: Self-pay | Admitting: *Deleted

## 2019-02-04 ENCOUNTER — Other Ambulatory Visit: Payer: Self-pay

## 2019-02-04 ENCOUNTER — Inpatient Hospital Stay (HOSPITAL_COMMUNITY)
Admission: EM | Admit: 2019-02-04 | Discharge: 2019-02-07 | DRG: 558 | Disposition: A | Discharge status: Home / Self Care | Payer: Self-pay | Attending: Family Medicine | Admitting: Family Medicine

## 2019-02-04 DIAGNOSIS — R300 Dysuria: Secondary | ICD-10-CM | POA: Diagnosis present

## 2019-02-04 DIAGNOSIS — F17210 Nicotine dependence, cigarettes, uncomplicated: Secondary | ICD-10-CM | POA: Diagnosis present

## 2019-02-04 DIAGNOSIS — F1520 Other stimulant dependence, uncomplicated: Secondary | ICD-10-CM | POA: Diagnosis present

## 2019-02-04 DIAGNOSIS — M6282 Rhabdomyolysis: Principal | ICD-10-CM | POA: Diagnosis present

## 2019-02-04 DIAGNOSIS — R740 Nonspecific elevation of levels of transaminase and lactic acid dehydrogenase [LDH]: Secondary | ICD-10-CM | POA: Diagnosis present

## 2019-02-04 DIAGNOSIS — F1510 Other stimulant abuse, uncomplicated: Secondary | ICD-10-CM | POA: Diagnosis present

## 2019-02-04 DIAGNOSIS — F1910 Other psychoactive substance abuse, uncomplicated: Secondary | ICD-10-CM | POA: Diagnosis present

## 2019-02-04 NOTE — ED Triage Notes (Signed)
Pt c/o burning with urination and abdominal pain; pt states he was just seen here x 3 days ago for meth addiction; pt informed EMS he was homeless and didn't have any place to go

## 2019-02-04 NOTE — ED Provider Notes (Signed)
[facility] EMERGENCY DEPARTMENT Provider Note   CSN: [id number] Arrival date & time: 02/04/19  2314    History   Chief Complaint Chief Complaint  Patient presents with  . Dysuria    HPI Anthony Callahan is a 53 y.o. male.     Patient is a 53 year old male who presents to the emergency department with a complaint of burning with urination and abdominal pain.  The patient states that he was just here 2 to 3 days ago for assistance with a methamphetamine addiction.  He was evaluated and he was told that he had some abnormalities on his test that could affect his kidney.  He was admitted, but signed himself out AGAINST MEDICAL ADVICE because he said he had to maintain his job.  The patient stated he had to leave his job because of the burning sensation and abdominal pain.  He also states that his muscles "felt funny" and he was noticing muscle cramps in various areas.  He denies fever.  He complains of fatigue.  There is been no vomiting or diarrhea reported.  The patient does state that he use some crystal meth this morning.  Patient states that he drinks occasionally but has not been drinking recently.  He request evaluation not only of the dysuria, but also of the muscle cramps and body feeling funny.  The history is provided by the patient.  Dysuria  Presenting symptoms: dysuria   Presenting symptoms: no penile discharge   Context: not after injury   Relieved by:  Nothing Worsened by:  Urination Associated symptoms: abdominal pain   Associated symptoms comment:  "my body feels weird" MOuth feels dry. I did some METH earlier today.   History reviewed. No pertinent past medical history.  Patient Active Problem List   Diagnosis Date Noted  . Rhabdomyolysis 02/03/2019    History reviewed. No pertinent surgical history.      Home Medications    Prior to Admission medications   Medication Sig Start Date End Date Taking? Authorizing Provider  methocarbamol (ROBAXIN) 500 MG  tablet 2 po tid for spasm around ribs 12/21/11   Ivery Quale, PA-C    Family History History reviewed. No pertinent family history.  Social History Social History   Tobacco Use  . Smoking status: Current Every Day Smoker    Packs/day: 1.00    Types: Cigarettes  . Smokeless tobacco: Never Used  Substance Use Topics  . Alcohol use: Yes    Comment: 6 pk/day  . Drug use: Yes    Types: Methamphetamines    Comment: snorts meth every couple days. last use was last night     Allergies   Patient has no known allergies.   Review of Systems Review of Systems  Gastrointestinal: Positive for abdominal pain.  Genitourinary: Positive for dysuria. Negative for discharge.  Musculoskeletal: Positive for arthralgias and myalgias.       Muscle soreness and cramping.     Physical Exam Updated Vital Signs BP (!) 141/96 (BP Location: Left Arm)   Pulse 85   Temp 97.9 F (36.6 C) (Oral)   Resp 18   Ht 6\' 2"  (1.88 m)   Wt 81.6 kg   SpO2 100%   BMI 23.11 kg/m   Physical Exam Vitals signs and nursing note reviewed.  Constitutional:      Appearance: He is well-developed. He is not toxic-appearing.  HENT:     Head: Normocephalic.     Right Ear: Tympanic membrane and external ear normal.  Left Ear: Tympanic membrane and external ear normal.  Eyes:     General: Lids are normal.     Pupils: Pupils are equal, round, and reactive to light.  Neck:     Musculoskeletal: Normal range of motion and neck supple.     Vascular: No carotid bruit.  Cardiovascular:     Rate and Rhythm: Normal rate and regular rhythm.     Pulses: Normal pulses.     Heart sounds: Normal heart sounds.  Pulmonary:     Effort: No respiratory distress.     Breath sounds: Normal breath sounds.  Abdominal:     General: Bowel sounds are normal.     Palpations: Abdomen is soft.     Tenderness: There is no abdominal tenderness. There is no guarding.  Musculoskeletal: Normal range of motion.     Comments:  Diffuse soreness of multiple sites.  The patient has full range of motion of upper and lower extremities.  Radial pulses are 2+.  Dorsalis pedis pulses are 2+.  There are no temperature changes of the upper or lower extremities.  Lymphadenopathy:     Head:     Right side of head: No submandibular adenopathy.     Left side of head: No submandibular adenopathy.     Cervical: No cervical adenopathy.  Skin:    General: Skin is warm and dry.  Neurological:     Mental Status: He is alert and oriented to person, place, and time.     Cranial Nerves: No cranial nerve deficit.     Sensory: No sensory deficit.  Psychiatric:        Speech: Speech normal.        Thought Content: Thought content does not include homicidal or suicidal ideation. Thought content does not include homicidal or suicidal plan.      ED Treatments / Results  Labs (all labs ordered are listed, but only abnormal results are displayed) Labs Reviewed - No data to display  EKG None  Radiology No results found.  Procedures Procedures (including critical care time)  Medications Ordered in ED Medications - No data to display   Initial Impression / Assessment and Plan / ED Course  I have reviewed the triage vital signs and the nursing notes.  Pertinent labs & imaging results that were available during my care of the patient were reviewed by me and considered in my medical decision making (see chart for details).          Final Clinical Impressions(s) / ED Diagnoses MDM  Vital signs reviewed.  Blood pressure slightly elevated at 141/96, otherwise vital signs within normal limits.  Pulse oximetry is 100% on room air.  Within normal limits by my interpretation.  Patient presents with dysuria, abdominal discomfort, diffuse muscle pain, and body feeling funny.  Patient was evaluated and admitted to the hospital because of what appeared to be a rhabdomyolysis.  The patient uses crystal meth.  He says he also uses  alcohol and marijuana from time to time but has not been using recently.  The patient states that he did use some crystal meth this morning.  His symptoms have become increasingly worse and so he came back to the emergency department for evaluation.  Urine analysis reveals a straw-colored specimen with a specific gravity of 1.004.  There is moderate blood on the dipstick, otherwise the urine analysis is within normal limits.  The electrocardiogram shows normal sinus rhythm.  There is no high degree blocks, and no  evidence of an acute STEMI.  Labs and Xray pending. Pt's care to be continued by Dr Preston Fleeting.   Final diagnoses:  Non-traumatic rhabdomyolysis    ED Discharge Orders    None       Ivery Quale, PA-C 02/05/19 1120    Dione Booze, MD 02/05/19 2238

## 2019-02-05 ENCOUNTER — Encounter (HOSPITAL_COMMUNITY): Payer: Self-pay | Admitting: *Deleted

## 2019-02-05 ENCOUNTER — Other Ambulatory Visit: Payer: Self-pay

## 2019-02-05 ENCOUNTER — Emergency Department (HOSPITAL_COMMUNITY): Payer: Self-pay

## 2019-02-05 DIAGNOSIS — M6282 Rhabdomyolysis: Principal | ICD-10-CM

## 2019-02-05 LAB — CBC WITH DIFFERENTIAL/PLATELET
Abs Immature Granulocytes: 0.01 10*3/uL (ref 0.00–0.07)
Basophils Absolute: 0 10*3/uL (ref 0.0–0.1)
Basophils Relative: 0 %
EOS ABS: 0 10*3/uL (ref 0.0–0.5)
EOS PCT: 1 %
HCT: 43 % (ref 39.0–52.0)
Hemoglobin: 14.1 g/dL (ref 13.0–17.0)
Immature Granulocytes: 0 %
Lymphocytes Relative: 31 %
Lymphs Abs: 1.6 10*3/uL (ref 0.7–4.0)
MCH: 32 pg (ref 26.0–34.0)
MCHC: 32.8 g/dL (ref 30.0–36.0)
MCV: 97.7 fL (ref 80.0–100.0)
Monocytes Absolute: 0.4 10*3/uL (ref 0.1–1.0)
Monocytes Relative: 8 %
Neutro Abs: 3.1 10*3/uL (ref 1.7–7.7)
Neutrophils Relative %: 60 %
PLATELETS: 225 10*3/uL (ref 150–400)
RBC: 4.4 MIL/uL (ref 4.22–5.81)
RDW: 13.4 % (ref 11.5–15.5)
WBC: 5.2 10*3/uL (ref 4.0–10.5)
nRBC: 0 % (ref 0.0–0.2)

## 2019-02-05 LAB — COMPREHENSIVE METABOLIC PANEL
ALT: 103 U/L — ABNORMAL HIGH (ref 0–44)
AST: 241 U/L — ABNORMAL HIGH (ref 15–41)
Albumin: 4 g/dL (ref 3.5–5.0)
Alkaline Phosphatase: 61 U/L (ref 38–126)
Anion gap: 1 — ABNORMAL LOW (ref 5–15)
BUN: 11 mg/dL (ref 6–20)
CALCIUM: 8.5 mg/dL — AB (ref 8.9–10.3)
CO2: 30 mmol/L (ref 22–32)
Chloride: 104 mmol/L (ref 98–111)
Creatinine, Ser: 0.73 mg/dL (ref 0.61–1.24)
Glucose, Bld: 89 mg/dL (ref 70–99)
Potassium: 3.5 mmol/L (ref 3.5–5.1)
Sodium: 135 mmol/L (ref 135–145)
Total Bilirubin: 0.8 mg/dL (ref 0.3–1.2)
Total Protein: 7.1 g/dL (ref 6.5–8.1)

## 2019-02-05 LAB — AMMONIA: Ammonia: 19 umol/L (ref 9–35)

## 2019-02-05 LAB — URINALYSIS, ROUTINE W REFLEX MICROSCOPIC
Bacteria, UA: NONE SEEN
Bilirubin Urine: NEGATIVE
Glucose, UA: NEGATIVE mg/dL
Ketones, ur: NEGATIVE mg/dL
Leukocytes,Ua: NEGATIVE
Nitrite: NEGATIVE
Protein, ur: NEGATIVE mg/dL
SPECIFIC GRAVITY, URINE: 1.004 — AB (ref 1.005–1.030)
pH: 6 (ref 5.0–8.0)

## 2019-02-05 LAB — CK
Total CK: 11251 U/L — ABNORMAL HIGH (ref 49–397)
Total CK: 6814 U/L — ABNORMAL HIGH (ref 49–397)

## 2019-02-05 LAB — LIPASE, BLOOD: Lipase: 34 U/L (ref 11–51)

## 2019-02-05 MED ORDER — NICOTINE 14 MG/24HR TD PT24
14.0000 mg | MEDICATED_PATCH | Freq: Once | TRANSDERMAL | Status: AC
  Administered 2019-02-05: 14 mg via TRANSDERMAL
  Filled 2019-02-05: qty 1

## 2019-02-05 MED ORDER — ONDANSETRON HCL 4 MG PO TABS: 4.0000 mg | ORAL_TABLET | Freq: Four times a day (QID) | ORAL | Status: DC | PRN

## 2019-02-05 MED ORDER — METHOCARBAMOL 500 MG PO TABS: 500.0000 mg | ORAL_TABLET | Freq: Four times a day (QID) | ORAL | Status: DC | PRN

## 2019-02-05 MED ORDER — ENOXAPARIN SODIUM 40 MG/0.4ML ~~LOC~~ SOLN
40.0000 mg | SUBCUTANEOUS | Status: DC
  Administered 2019-02-05 – 2019-02-07 (×3): 40 mg via SUBCUTANEOUS
  Filled 2019-02-05 (×3): qty 0.4

## 2019-02-05 MED ORDER — SODIUM CHLORIDE 0.9 % IV BOLUS
1000.0000 mL | Freq: Once | INTRAVENOUS | Status: AC
  Administered 2019-02-05: 1000 mL via INTRAVENOUS

## 2019-02-05 MED ORDER — ONDANSETRON HCL 4 MG/2ML IJ SOLN: 4.0000 mg | Freq: Four times a day (QID) | INTRAMUSCULAR | Status: DC | PRN

## 2019-02-05 MED ORDER — SODIUM CHLORIDE 0.9 % IV SOLN
INTRAVENOUS | Status: DC
  Administered 2019-02-05 – 2019-02-07 (×8): via INTRAVENOUS

## 2019-02-05 NOTE — ED Notes (Signed)
Patient's IVF paused and iv secured and covered for patient to take shower.

## 2019-02-05 NOTE — ED Notes (Addendum)
Pt given meal

## 2019-02-05 NOTE — H&P (Signed)
TRH H&P    Patient Demographics:    Anthony Callahan, is a 53 y.o. male  MRN: [id number]  DOB - 07/01/1966  Admit Date - 02/04/2019  Referring MD/NP/PA: Dr. Preston FleetingGlick  Outpatient Primary MD for the patient is Patient, No Pcp Per  Patient coming from: Home  Chief complaint-muscle aches   HPI:    Anthony DullJeffrey Anfinson  is a 53 y.o. male, with history of polysubstance abuse, methamphetamine use who was recently admitted to the hospital for detox and rhabdomyolysis.  Patient left AMA at that time as he had to go back to his job as per patient.  Patient says that his symptoms got worse he has worsening weakness of lower extremities especially hamstrings. Lab work in the ED showed worsening of CK up to 11,251, it was 7786 on 02/03/2019. Denies chest pain or shortness of breath. Complains of nausea but no vomiting Complains of intermittent burning in urination     Review of systems:    In addition to the HPI above,   All other systems reviewed and are negative.    Past History of the following :      Social History:      Social History   Tobacco Use  . Smoking status: Current Every Day Smoker    Packs/day: 1.00    Types: Cigarettes  . Smokeless tobacco: Never Used  Substance Use Topics  . Alcohol use: Yes    Comment: 6 pk/day       Family History :   No family history of cancer   Home Medications:   Prior to Admission medications   Medication Sig Start Date End Date Taking? Authorizing Provider  methocarbamol (ROBAXIN) 500 MG tablet 2 po tid for spasm around ribs 12/21/11   Ivery QualeBryant, [place], PA-C     Allergies:    No Known Allergies   Physical Exam:   Vitals  Blood pressure (!) 141/96, pulse 85, temperature 97.9 F (36.6 C), temperature source Oral, resp. rate 18, height 6\' 2"  (1.88 m), weight 81.6 kg, SpO2 100 %.  1.  General: Appears in no acute distress  2. Psychiatric: Alert,  oriented x3, no focal deficit noted  3. Neurologic: Cranial nerve II through grossly intact, motor strength 5/5 in all extremities  4. HEENMT:  Atraumatic normocephalic, extraocular muscles intact  5. Respiratory : Clear to auscultation bilaterally, no wheezing or crackles  6. Cardiovascular : S1-S2, regular, no murmur auscultated  7. Gastrointestinal:  Abdomen soft, nontender, no organomegaly  8. Skin:  No rashes noted  9.Musculoskeletal:  Mild tenderness in muscles of lower extremities    Data Review:    CBC Recent Labs  Lab 02/02/19 2230 02/03/19 0530 02/05/19 0055  WBC 5.5 4.5 5.2  HGB 14.3 13.7 14.1  HCT 45.3 43.3 43.0  PLT 243 201 225  MCV 95.4 98.6 97.7  MCH 30.1 31.2 32.0  MCHC 31.6 31.6 32.8  RDW 13.5 13.6 13.4  LYMPHSABS 1.3  --  1.6  MONOABS 0.6  --  0.4  EOSABS 0.1  --  0.0  BASOSABS 0.0  --  0.0   ------------------------------------------------------------------------------------------------------------------  Results for orders placed or performed during the hospital encounter of 02/04/19 (from the past 48 hour(s))  Urinalysis, Routine w reflex microscopic     Status: Abnormal   Collection Time: 02/05/19 12:15 AM  Result Value Ref Range   Color, Urine STRAW (A) YELLOW   APPearance CLEAR CLEAR   Specific Gravity, Urine 1.004 (L) 1.005 - 1.030   pH 6.0 5.0 - 8.0   Glucose, UA NEGATIVE NEGATIVE mg/dL   Hgb urine dipstick MODERATE (A) NEGATIVE   Bilirubin Urine NEGATIVE NEGATIVE   Ketones, ur NEGATIVE NEGATIVE mg/dL   Protein, ur NEGATIVE NEGATIVE mg/dL   Nitrite NEGATIVE NEGATIVE   Leukocytes,Ua NEGATIVE NEGATIVE   RBC / HPF 0-5 0 - 5 RBC/hpf   WBC, UA 0-5 0 - 5 WBC/hpf   Bacteria, UA NONE SEEN NONE SEEN    Comment: Performed at [facility], [address], [place], [place] [address]  Comprehensive metabolic panel     Status: Abnormal   Collection Time: 02/05/19 12:55 AM  Result Value Ref Range   Sodium 135 135 - 145 mmol/L   Potassium  3.5 3.5 - 5.1 mmol/L   Chloride 104 98 - 111 mmol/L   CO2 30 22 - 32 mmol/L   Glucose, Bld 89 70 - 99 mg/dL   BUN 11 6 - 20 mg/dL   Creatinine, Ser 3.01 0.61 - 1.24 mg/dL   Calcium 8.5 (L) 8.9 - 10.3 mg/dL   Total Protein 7.1 6.5 - 8.1 g/dL   Albumin 4.0 3.5 - 5.0 g/dL   AST 601 (H) 15 - 41 U/L   ALT 103 (H) 0 - 44 U/L   Alkaline Phosphatase 61 38 - 126 U/L   Total Bilirubin 0.8 0.3 - 1.2 mg/dL   GFR calc non Af Amer >60 >60 mL/min   GFR calc Af Amer >60 >60 mL/min   Anion gap 1 (L) 5 - 15    Comment: Performed at [facility], [address] [place], [place] [address]  Lipase, blood     Status: None   Collection Time: 02/05/19 12:55 AM  Result Value Ref Range   Lipase 34 11 - 51 U/L    Comment: Performed at [facility], [address], [place], [place] [address]  CBC with Differential     Status: None   Collection Time: 02/05/19 12:55 AM  Result Value Ref Range   WBC 5.2 4.0 - 10.5 K/uL   RBC 4.40 4.22 - 5.81 MIL/uL   Hemoglobin 14.1 13.0 - 17.0 g/dL   HCT 20.2 54.2 - 70.6 %   MCV 97.7 80.0 - 100.0 fL   MCH 32.0 26.0 - 34.0 pg   MCHC 32.8 30.0 - 36.0 g/dL   RDW 23.7 62.8 - 31.5 %   Platelets 225 150 - 400 K/uL   nRBC 0.0 0.0 - 0.2 %   Neutrophils Relative % 60 %   Neutro Abs 3.1 1.7 - 7.7 K/uL   Lymphocytes Relative 31 %   Lymphs Abs 1.6 0.7 - 4.0 K/uL   Monocytes Relative 8 %   Monocytes Absolute 0.4 0.1 - 1.0 K/uL   Eosinophils Relative 1 %   Eosinophils Absolute 0.0 0.0 - 0.5 K/uL   Basophils Relative 0 %   Basophils Absolute 0.0 0.0 - 0.1 K/uL   Immature Granulocytes 0 %   Abs Immature Granulocytes 0.01 0.00 - 0.07 K/uL    Comment: Performed at [facility],  [address] [place], [place] [address]  CK     Status: Abnormal   Collection Time: 02/05/19 12:55 AM  Result Value Ref Range   Total CK 11,251 (H) 49 - 397 U/L    Comment: RESULTS CONFIRMED BY MANUAL DILUTION Performed at [facility], [address], [place], [place] [address]   Ammonia     Status:  None   Collection Time: 02/05/19 12:56 AM  Result Value Ref Range   Ammonia 19 9 - 35 umol/L    Comment: Performed at [facility], [address] [place], [place] [address]    Chemistries  Recent Labs  Lab 02/02/19 2230 02/03/19 0530 02/05/19 0055  NA 132* 137 135  K 3.5 3.6 3.5  CL 101 111 104  CO2 22 21* 30  GLUCOSE 96 99 89  BUN 21* 18 11  CREATININE 0.90 0.83 0.73  CALCIUM 8.6* 8.1* 8.5*  AST 169* 137* 241*  ALT 87* 72* 103*  ALKPHOS 75 60 61  BILITOT 1.4* 1.1 0.8   ------------------------------------------------------------------------------------------------------------------  ------------------------------------------------------------------------------------------------------------------ GFR: Estimated Creatinine Clearance: 127.5 mL/min (by C-G formula based on SCr of 0.73 mg/dL). Liver Function Tests: Recent Labs  Lab 02/02/19 2230 02/03/19 0530 02/05/19 0055  AST 169* 137* 241*  ALT 87* 72* 103*  ALKPHOS 75 60 61  BILITOT 1.4* 1.1 0.8  PROT 7.6 6.2* 7.1  ALBUMIN 4.2 3.4* 4.0   Recent Labs  Lab 02/05/19 0055  LIPASE 34   Recent Labs  Lab 02/05/19 0056  AMMONIA 19   Coagulation Profile: No results for input(s): INR, PROTIME in the last 168 hours. Cardiac Enzymes: Recent Labs  Lab 02/02/19 2230 02/03/19 0530 02/05/19 0055  CKTOTAL 7,786* 4,719* 11,251*   BNP (last 3 results) No results for input(s): PROBNP in the last 8760 hours. HbA1C: No results for input(s): HGBA1C in the last 72 hours. CBG: No results for input(s): GLUCAP in the last 168 hours. Lipid Profile: No results for input(s): CHOL, HDL, LDLCALC, TRIG, CHOLHDL, LDLDIRECT in the last 72 hours. Thyroid Function Tests: No results for input(s): TSH, T4TOTAL, FREET4, T3FREE, THYROIDAB in the last 72 hours. Anemia Panel: No results for input(s): VITAMINB12, FOLATE, FERRITIN, TIBC, IRON, RETICCTPCT in the last 72  hours.  --------------------------------------------------------------------------------------------------------------- Urine analysis:    Component Value Date/Time   COLORURINE STRAW (A) 02/05/2019 0015   APPEARANCEUR CLEAR 02/05/2019 0015   LABSPEC 1.004 (L) 02/05/2019 0015   PHURINE 6.0 02/05/2019 0015   GLUCOSEU NEGATIVE 02/05/2019 0015   HGBUR MODERATE (A) 02/05/2019 0015   BILIRUBINUR NEGATIVE 02/05/2019 0015   KETONESUR NEGATIVE 02/05/2019 0015   PROTEINUR NEGATIVE 02/05/2019 0015   NITRITE NEGATIVE 02/05/2019 0015   LEUKOCYTESUR NEGATIVE 02/05/2019 0015      Imaging Results:    Dg Chest 2 View  Result Date: 02/05/2019 CLINICAL DATA:  Diffuse muscle ache and spasm EXAM: CHEST - 2 VIEW COMPARISON:  None. FINDINGS: The heart size and mediastinal contours are within normal limits. Mild pulmonary hyperinflation is noted. Both lungs are clear. Healed fracture deformity of the right posterior fifth rib. Nodular densities project over the mid to lower lung bilaterally at the same level consistent with nipple shadows. Costochondral calcification and cartilage off the anterior right sixth rib is believed to account for ovoid density seen at the right lung base on the frontal view. No mass is apparent on the lateral to account for this finding. IMPRESSION: Mild pulmonary hyperinflation. Electronically Signed   By: Tollie Eth M.D.   On: 02/05/2019 01:38  My personal review of EKG: Rhythm NSR   Assessment & Plan:    Active Problems:   Rhabdomyolysis   1. Rhabdomyolysis-CK is  Now elevated up to 11,000, start aggressive IV hydration with normal saline at 150 mL/h.  Will repeat another CK at 1 PM.  Renal function is normal.  Follow BMP in a.m.  2. History of methamphetamine use-patient again used methamphetamine yesterday morning.  Will need social work consult for outpatient substance abuse program at the time of discharge  3. Transaminitis-AST 241, ALT 103, secondary to  rhabdomyolysis.  Follow liver enzymes in a.m.    DVT Prophylaxis-   Lovenox   AM Labs Ordered, also please review Full Orders  Family Communication: Admission, patients condition and plan of care including tests being ordered have been discussed with the patient  who indicate understanding and agree with the plan and Code Status.  Code Status: Full code  Admission status: Inpatient: Based on patients clinical presentation and evaluation of above clinical data, I have made determination that patient meets Inpatient criteria at this time.  Time spent in minutes : 60 minutes   Meredeth Ide M.D on 02/05/2019 at 3:35 AM

## 2019-02-05 NOTE — Progress Notes (Signed)
Patient seen and evaluated, chart reviewed, please see EMR for updated orders. Please see full H&P dictated by admitting physician Dr Sharl Ma for same date of service.    53 year old with polysubstance abuse especially methamphetamine recently left AMA after treatment for rhabdomyolysis on 02/03/2020 patient left AMA CK was 4,719, returns now with myalgias and now CKs up to 11,251, patient admits to ongoing daily methamphetamine use   1) rhabdomyolysis --- methamphetamine use induced, continue IV fluids  2) polysubstance abuse including methamphetamine--- will need outpatient rehab   Patient seen and evaluated, chart reviewed, please see EMR for updated orders. Please see full H&P dictated by admitting physician Dr Sharl Ma for same date of service.

## 2019-02-05 NOTE — ED Notes (Signed)
ED TO INPATIENT HANDOFF REPORT  ED Nurse Name and Phone #: Tarri Glenn RN [phone]  S Name/Age/Gender Anthony Callahan 53 y.o. male Room/Bed: APA19/APA19  Code Status   Code Status: Full Code  Home/SNF/Other Home Patient oriented to: situation Is this baseline? Yes   Triage Complete: Triage complete  Chief Complaint dysuria  Triage Note Pt c/o burning with urination and abdominal pain; pt states he was just seen here x 3 days ago for meth addiction; pt informed EMS he was homeless and didn't have any place to go   Allergies No Known Allergies  Level of Care/Admitting Diagnosis ED Disposition    ED Disposition Condition Comment   Admit  Hospital Area: [facility] [[id number]]  Level of Care: Med-Surg [16]  Diagnosis: Rhabdomyolysis [728.88.ICD-9-CM]  Admitting Physician: Lovie Chol  Attending Physician: Meredeth Ide [[id number]]  Estimated length of stay: 3 - 4 days  Certification:: I certify this patient will need inpatient services for at least 2 midnights  PT Class (Do Not Modify): Inpatient [101]  PT Acc Code (Do Not Modify): Private [1]       B Medical/Surgery History History reviewed. No pertinent past medical history. History reviewed. No pertinent surgical history.   A IV Location/Drains/Wounds Patient Lines/Drains/Airways Status   Active Line/Drains/Airways    Name:   Placement date:   Placement time:   Site:   Days:   Peripheral IV 02/05/19 Left Forearm   02/05/19    0309    Forearm   less than 1          Intake/Output Last 24 hours No intake or output data in the 24 hours ending 02/05/19 1121  Labs/Imaging Results for orders placed or performed during the hospital encounter of 02/04/19 (from the past 48 hour(s))  Urinalysis, Routine w reflex microscopic     Status: Abnormal   Collection Time: 02/05/19 12:15 AM  Result Value Ref Range   Color, Urine STRAW (A) YELLOW   APPearance CLEAR CLEAR   Specific Gravity, Urine 1.004  (L) 1.005 - 1.030   pH 6.0 5.0 - 8.0   Glucose, UA NEGATIVE NEGATIVE mg/dL   Hgb urine dipstick MODERATE (A) NEGATIVE   Bilirubin Urine NEGATIVE NEGATIVE   Ketones, ur NEGATIVE NEGATIVE mg/dL   Protein, ur NEGATIVE NEGATIVE mg/dL   Nitrite NEGATIVE NEGATIVE   Leukocytes,Ua NEGATIVE NEGATIVE   RBC / HPF 0-5 0 - 5 RBC/hpf   WBC, UA 0-5 0 - 5 WBC/hpf   Bacteria, UA NONE SEEN NONE SEEN    Comment: Performed at [facility], [address] [place], [place] [address]  Comprehensive metabolic panel     Status: Abnormal   Collection Time: 02/05/19 12:55 AM  Result Value Ref Range   Sodium 135 135 - 145 mmol/L   Potassium 3.5 3.5 - 5.1 mmol/L   Chloride 104 98 - 111 mmol/L   CO2 30 22 - 32 mmol/L   Glucose, Bld 89 70 - 99 mg/dL   BUN 11 6 - 20 mg/dL   Creatinine, Ser 9.15 0.61 - 1.24 mg/dL   Calcium 8.5 (L) 8.9 - 10.3 mg/dL   Total Protein 7.1 6.5 - 8.1 g/dL   Albumin 4.0 3.5 - 5.0 g/dL   AST 056 (H) 15 - 41 U/L   ALT 103 (H) 0 - 44 U/L   Alkaline Phosphatase 61 38 - 126 U/L   Total Bilirubin 0.8 0.3 - 1.2 mg/dL   GFR calc non Af Amer >60 >  60 mL/min   GFR calc Af Amer >60 >60 mL/min   Anion gap 1 (L) 5 - 15    Comment: Performed at [facility], [address] [place], [place] [address]  Lipase, blood     Status: None   Collection Time: 02/05/19 12:55 AM  Result Value Ref Range   Lipase 34 11 - 51 U/L    Comment: Performed at [facility], [address], [place], [place] [address]  CBC with Differential     Status: None   Collection Time: 02/05/19 12:55 AM  Result Value Ref Range   WBC 5.2 4.0 - 10.5 K/uL   RBC 4.40 4.22 - 5.81 MIL/uL   Hemoglobin 14.1 13.0 - 17.0 g/dL   HCT 84.1 32.4 - 40.1 %   MCV 97.7 80.0 - 100.0 fL   MCH 32.0 26.0 - 34.0 pg   MCHC 32.8 30.0 - 36.0 g/dL   RDW 02.7 25.3 - 66.4 %   Platelets 225 150 - 400 K/uL   nRBC 0.0 0.0 - 0.2 %   Neutrophils Relative % 60 %   Neutro Abs 3.1 1.7 - 7.7 K/uL   Lymphocytes Relative 31 %   Lymphs Abs 1.6 0.7 - 4.0 K/uL    Monocytes Relative 8 %   Monocytes Absolute 0.4 0.1 - 1.0 K/uL   Eosinophils Relative 1 %   Eosinophils Absolute 0.0 0.0 - 0.5 K/uL   Basophils Relative 0 %   Basophils Absolute 0.0 0.0 - 0.1 K/uL   Immature Granulocytes 0 %   Abs Immature Granulocytes 0.01 0.00 - 0.07 K/uL    Comment: Performed at [facility], [address], [place], [place] [address]  CK     Status: Abnormal   Collection Time: 02/05/19 12:55 AM  Result Value Ref Range   Total CK 11,251 (H) 49 - 397 U/L    Comment: RESULTS CONFIRMED BY MANUAL DILUTION Performed at [facility], [address], [place], [place] [address]   Ammonia     Status: None   Collection Time: 02/05/19 12:56 AM  Result Value Ref Range   Ammonia 19 9 - 35 umol/L    Comment: Performed at [facility], [address], [place], [place] [address]   Dg Chest 2 View  Result Date: 02/05/2019 CLINICAL DATA:  Diffuse muscle ache and spasm EXAM: CHEST - 2 VIEW COMPARISON:  None. FINDINGS: The heart size and mediastinal contours are within normal limits. Mild pulmonary hyperinflation is noted. Both lungs are clear. Healed fracture deformity of the right posterior fifth rib. Nodular densities project over the mid to lower lung bilaterally at the same level consistent with nipple shadows. Costochondral calcification and cartilage off the anterior right sixth rib is believed to account for ovoid density seen at the right lung base on the frontal view. No mass is apparent on the lateral to account for this finding. IMPRESSION: Mild pulmonary hyperinflation. Electronically Signed   By: Tollie Eth M.D.   On: 02/05/2019 01:38    Pending Labs Unresulted Labs (From admission, onward)    Start     Ordered   02/12/19 0500  Creatinine, serum  (enoxaparin (LOVENOX)    CrCl >/= 30 ml/min)  Weekly,   R    Comments:  while on enoxaparin therapy    02/05/19 0339   02/05/19 1300  CK  Once,   R     02/05/19 0339   02/05/19 0340  HIV antibody (Routine Testing)  Once,   R  02/05/19 0339          Vitals/Pain Today's Vitals   02/05/19 0608 02/05/19 0656 02/05/19 0854 02/05/19 1116  BP: 116/67  137/89 122/68  Pulse: 80  81 75  Resp: 16  16 16   Temp:    98 F (36.7 C)  TempSrc:    Oral  SpO2: 99%  100% 100%  Weight:      Height:      PainSc:  3       Isolation Precautions No active isolations  Medications Medications  nicotine (NICODERM CQ - dosed in mg/24 hours) patch 14 mg (14 mg Transdermal Patch Applied 02/05/19 0326)  methocarbamol (ROBAXIN) tablet 500 mg (has no administration in time range)  enoxaparin (LOVENOX) injection 40 mg (40 mg Subcutaneous Given 02/05/19 0852)  0.9 %  sodium chloride infusion ( Intravenous Restarted 02/05/19 1040)  ondansetron (ZOFRAN) tablet 4 mg (has no administration in time range)    Or  ondansetron (ZOFRAN) injection 4 mg (has no administration in time range)  sodium chloride 0.9 % bolus 1,000 mL (0 mLs Intravenous Stopped 02/05/19 0349)    Mobility walks Low fall risk   Focused Assessments CK elevated   R Recommendations: See Admitting Provider Note  Report given to: Johnsie Cancel RN  Additional Notes: Alert and oriented

## 2019-02-06 DIAGNOSIS — F1510 Other stimulant abuse, uncomplicated: Secondary | ICD-10-CM | POA: Diagnosis present

## 2019-02-06 DIAGNOSIS — F1910 Other psychoactive substance abuse, uncomplicated: Secondary | ICD-10-CM | POA: Diagnosis present

## 2019-02-06 LAB — CK: Total CK: 3002 U/L — ABNORMAL HIGH (ref 49–397)

## 2019-02-06 LAB — HIV ANTIBODY (ROUTINE TESTING W REFLEX): HIV Screen 4th Generation wRfx: NONREACTIVE

## 2019-02-06 MED ORDER — GABAPENTIN 300 MG PO CAPS
300.0000 mg | ORAL_CAPSULE | Freq: Three times a day (TID) | ORAL | Status: DC
  Administered 2019-02-06 – 2019-02-07 (×4): 300 mg via ORAL
  Filled 2019-02-06 (×4): qty 1

## 2019-02-06 MED ORDER — HYDROXYZINE HCL 25 MG PO TABS
25.0000 mg | ORAL_TABLET | Freq: Two times a day (BID) | ORAL | Status: DC
  Administered 2019-02-06 – 2019-02-07 (×3): 25 mg via ORAL
  Filled 2019-02-06 (×3): qty 1

## 2019-02-06 MED ORDER — HYDROXYZINE HCL 25 MG PO TABS
50.0000 mg | ORAL_TABLET | Freq: Every day | ORAL | Status: DC
  Administered 2019-02-06: 50 mg via ORAL
  Filled 2019-02-06: qty 2

## 2019-02-06 MED ORDER — METHOCARBAMOL 500 MG PO TABS
750.0000 mg | ORAL_TABLET | Freq: Four times a day (QID) | ORAL | Status: DC
  Administered 2019-02-06 – 2019-02-07 (×6): 750 mg via ORAL
  Filled 2019-02-06 (×5): qty 2

## 2019-02-06 NOTE — Progress Notes (Signed)
Anthony Callahan Demographics:    Anthony Callahan, is a 53 y.o. male, DOB - 1966-08-22, QBH:[id number]  Admit date - 02/04/2019   Admitting Physician Meredeth Ide, MD  Outpatient Primary MD for the Anthony Callahan is Anthony Callahan, No Pcp Per  LOS - 1   Chief Complaint  Anthony Callahan presents with  . Dysuria        Subjective:    Heng Goldner today has no fevers, no emesis,  No chest pain, voiding well, no new concerns  Assessment  & Plan :    Active Problems:   Rhabdomyolysis  Brief summary 53 year old with polysubstance abuse especially methamphetamine recently left AMA after treatment for rhabdomyolysis on 02/03/2020 Anthony Callahan left AMA CK was 4,719, returns now with myalgias and readmitted on 02/05/2019  With CKs up to 11,251, Anthony Callahan admits to ongoing daily methamphetamine use  Plan:- 1)Rhabdomyolysis --- methamphetamine use induced, improving with IV hydration, CK down to the 3000 range from over 11,000 on admission,  continue IV fluids   2)Polysubstance Abuse including Methamphetamine--- will need outpatient rehab, methocarbamol, gabapentin and Atarax as ordered to help blunt withdrawal symptoms  3)Social/Disposition--- Anthony Callahan's living situation is tenuous, he apparently lost the room he was renting from his boss, because he was doing drugs and did not show for work  Disposition/Need for in-Hospital Stay- Anthony Callahan unable to be discharged at this time due to weighted CK levels requiring further IV hydration to avoid kidney injury  Code Status : Full Code  Family Communication:   na   Disposition Plan  : home   Consults  :  na  DVT Prophylaxis  :  Lovenox -   Lab Results  Component Value Date   PLT 225 02/05/2019    Inpatient Medications  Scheduled Meds: . enoxaparin (LOVENOX) injection  40 mg Subcutaneous Q24H  . gabapentin  300 mg Oral TID  . hydrOXYzine  25 mg Oral BID  . hydrOXYzine  50 mg Oral QHS  .  methocarbamol  750 mg Oral QID   Continuous Infusions: . sodium chloride 150 mL/hr at 02/06/19 0925   PRN Meds:.methocarbamol, ondansetron **OR** ondansetron (ZOFRAN) IV    Anti-infectives (From admission, onward)   None        Objective:   Vitals:   02/05/19 1211 02/05/19 2040 02/05/19 2151 02/06/19 0547  BP: 130/77  134/75 124/83  Pulse: 81  68 62  Resp: 18  18 18   Temp: 98.1 F (36.7 C)  98.5 F (36.9 C) 98.2 F (36.8 C)  TempSrc: Oral  Oral Oral  SpO2: 99% 98% 99% 99%  Weight:      Height:        Wt Readings from Last 3 Encounters:  02/04/19 81.6 kg  02/02/19 81.6 kg  12/21/11 90.7 kg     Intake/Output Summary (Last 24 hours) at 02/06/2019 1128 Last data filed at 02/06/2019 0834 Gross per 24 hour  Intake 4947.36 ml  Output 2 ml  Net 4945.36 ml     Physical Exam Anthony Callahan is examined daily including today on 02/06/19 , exams remain the same as of yesterday except that has changed   Gen:- Awake Alert,  In no apparent distress  HEENT:- [place].AT, No sclera icterus Neck-Supple Neck,No JVD,.  Lungs-  CTAB , fair  symmetrical air movement CV- S1, S2 normal, regular  Abd-  +ve B.Sounds, Abd Soft, No tenderness,    Extremity/Skin:- No  edema, pedal pulses present  Psych-affect is appropriate, oriented x3 Neuro-no new focal deficits, no tremors   Data Review:   Micro Results No results found for this or any previous visit (from the past 240 hour(s)).  Radiology Reports Dg Chest 2 View  Result Date: 02/05/2019 CLINICAL DATA:  Diffuse muscle ache and spasm EXAM: CHEST - 2 VIEW COMPARISON:  None. FINDINGS: The heart size and mediastinal contours are within normal limits. Mild pulmonary hyperinflation is noted. Both lungs are clear. Healed fracture deformity of the right posterior fifth rib. Nodular densities project over the mid to lower lung bilaterally at the same level consistent with nipple shadows. Costochondral calcification and cartilage off the anterior right  sixth rib is believed to account for ovoid density seen at the right lung base on the frontal view. No mass is apparent on the lateral to account for this finding. IMPRESSION: Mild pulmonary hyperinflation. Electronically Signed   By: Tollie Eth M.D.   On: 02/05/2019 01:38     CBC Recent Labs  Lab 02/02/19 2230 02/03/19 0530 02/05/19 0055  WBC 5.5 4.5 5.2  HGB 14.3 13.7 14.1  HCT 45.3 43.3 43.0  PLT 243 201 225  MCV 95.4 98.6 97.7  MCH 30.1 31.2 32.0  MCHC 31.6 31.6 32.8  RDW 13.5 13.6 13.4  LYMPHSABS 1.3  --  1.6  MONOABS 0.6  --  0.4  EOSABS 0.1  --  0.0  BASOSABS 0.0  --  0.0    Chemistries  Recent Labs  Lab 02/02/19 2230 02/03/19 0530 02/05/19 0055  NA 132* 137 135  K 3.5 3.6 3.5  CL 101 111 104  CO2 22 21* 30  GLUCOSE 96 99 89  BUN 21* 18 11  CREATININE 0.90 0.83 0.73  CALCIUM 8.6* 8.1* 8.5*  AST 169* 137* 241*  ALT 87* 72* 103*  ALKPHOS 75 60 61  BILITOT 1.4* 1.1 0.8   ------------------------------------------------------------------------------------------------------------------ No results for input(s): CHOL, HDL, LDLCALC, TRIG, CHOLHDL, LDLDIRECT in the last 72 hours.  No results found for: HGBA1C ------------------------------------------------------------------------------------------------------------------ No results for input(s): TSH, T4TOTAL, T3FREE, THYROIDAB in the last 72 hours.  Invalid input(s): FREET3 ------------------------------------------------------------------------------------------------------------------ No results for input(s): VITAMINB12, FOLATE, FERRITIN, TIBC, IRON, RETICCTPCT in the last 72 hours.  Coagulation profile No results for input(s): INR, PROTIME in the last 168 hours.  No results for input(s): DDIMER in the last 72 hours.  Cardiac Enzymes No results for input(s): CKMB, TROPONINI, MYOGLOBIN in the last 168 hours.  Invalid input(s):  CK ------------------------------------------------------------------------------------------------------------------ No results found for: BNP   Shon Hale M.D on 02/06/2019 at 11:28 AM  Go to [email or web address] - for contact info  [facility] - Office  [phone]

## 2019-02-07 LAB — CK: Total CK: 1028 U/L — ABNORMAL HIGH (ref 49–397)

## 2019-02-07 MED ORDER — GABAPENTIN 600 MG PO TABS: 600.0000 mg | ORAL_TABLET | Freq: Three times a day (TID) | ORAL | 1 refills | Status: DC

## 2019-02-07 MED ORDER — HYDROXYZINE HCL 25 MG PO TABS: 25.0000 mg | ORAL_TABLET | Freq: Every day | ORAL | 0 refills | Status: DC

## 2019-02-07 MED ORDER — METHOCARBAMOL 750 MG PO TABS: ORAL_TABLET | ORAL | 1 refills | Status: DC

## 2019-02-07 NOTE — Plan of Care (Signed)
  Problem: Education: Goal: Knowledge of General Education information will improve Description Including pain rating scale, medication(s)/side effects and non-pharmacologic comfort measures 02/07/2019 1047 by Darreld Mclean, RN Outcome: Progressing 02/07/2019 1014 by Darreld Mclean, RN Outcome: Progressing   Problem: Health Behavior/Discharge Planning: Goal: Ability to manage health-related needs will improve 02/07/2019 1047 by Darreld Mclean, RN Outcome: Progressing 02/07/2019 1014 by Darreld Mclean, RN Outcome: Progressing   Problem: Clinical Measurements: Goal: Ability to maintain clinical measurements within normal limits will improve 02/07/2019 1047 by Darreld Mclean, RN Outcome: Progressing 02/07/2019 1014 by Darreld Mclean, RN Outcome: Progressing Goal: Will remain free from infection 02/07/2019 1047 by Darreld Mclean, RN Outcome: Progressing 02/07/2019 1014 by Darreld Mclean, RN Outcome: Progressing Goal: Diagnostic test results will improve 02/07/2019 1047 by Darreld Mclean, RN Outcome: Progressing 02/07/2019 1014 by Darreld Mclean, RN Outcome: Progressing Goal: Respiratory complications will improve 02/07/2019 1047 by Darreld Mclean, RN Outcome: Progressing 02/07/2019 1014 by Darreld Mclean, RN Outcome: Progressing Goal: Cardiovascular complication will be avoided 02/07/2019 1047 by Darreld Mclean, RN Outcome: Progressing 02/07/2019 1014 by Darreld Mclean, RN Outcome: Progressing   Problem: Activity: Goal: Risk for activity intolerance will decrease 02/07/2019 1047 by Darreld Mclean, RN Outcome: Progressing 02/07/2019 1014 by Darreld Mclean, RN Outcome: Progressing   Problem: Pain Managment: Goal: General experience of comfort will improve 02/07/2019 1047 by Darreld Mclean, RN Outcome: Progressing 02/07/2019 1014 by Darreld Mclean, RN Outcome: Progressing   Problem: Safety: Goal: Ability to remain free from injury will improve 02/07/2019 1047 by Darreld Mclean, RN Outcome: Progressing 02/07/2019 1014 by Darreld Mclean, RN Outcome: Progressing   Problem: Skin Integrity: Goal:  Risk for impaired skin integrity will decrease 02/07/2019 1047 by Darreld Mclean, RN Outcome: Progressing 02/07/2019 1014 by Darreld Mclean, RN Outcome: Progressing

## 2019-02-07 NOTE — Clinical Social Work Note (Signed)
Patient provided shelter list by CMA, Alm Bustard.     Ashritha Desrosiers, Juleen China, LCSW

## 2019-02-07 NOTE — Plan of Care (Signed)
  Problem: Education: Goal: Knowledge of General Education information will improve Description Including pain rating scale, medication(s)/side effects and non-pharmacologic comfort measures 02/07/2019 1446 by Darreld Mclean, RN Outcome: Progressing 02/07/2019 1047 by Darreld Mclean, RN Outcome: Progressing 02/07/2019 1014 by Darreld Mclean, RN Outcome: Progressing   Problem: Health Behavior/Discharge Planning: Goal: Ability to manage health-related needs will improve 02/07/2019 1446 by Darreld Mclean, RN Outcome: Progressing 02/07/2019 1047 by Darreld Mclean, RN Outcome: Progressing 02/07/2019 1014 by Darreld Mclean, RN Outcome: Progressing   Problem: Clinical Measurements: Goal: Ability to maintain clinical measurements within normal limits will improve 02/07/2019 1446 by Darreld Mclean, RN Outcome: Progressing 02/07/2019 1047 by Darreld Mclean, RN Outcome: Progressing 02/07/2019 1014 by Darreld Mclean, RN Outcome: Progressing Goal: Will remain free from infection 02/07/2019 1446 by Darreld Mclean, RN Outcome: Progressing 02/07/2019 1047 by Darreld Mclean, RN Outcome: Progressing 02/07/2019 1014 by Darreld Mclean, RN Outcome: Progressing Goal: Diagnostic test results will improve 02/07/2019 1446 by Darreld Mclean, RN Outcome: Progressing 02/07/2019 1047 by Darreld Mclean, RN Outcome: Progressing 02/07/2019 1014 by Darreld Mclean, RN Outcome: Progressing Goal: Respiratory complications will improve 02/07/2019 1446 by Darreld Mclean, RN Outcome: Progressing 02/07/2019 1047 by Darreld Mclean, RN Outcome: Progressing 02/07/2019 1014 by Darreld Mclean, RN Outcome: Progressing Goal: Cardiovascular complication will be avoided 02/07/2019 1446 by Darreld Mclean, RN Outcome: Progressing 02/07/2019 1047 by Darreld Mclean, RN Outcome: Progressing 02/07/2019 1014 by Darreld Mclean, RN Outcome: Progressing   Problem: Activity: Goal: Risk for activity intolerance will decrease 02/07/2019 1446 by Darreld Mclean, RN Outcome: Progressing 02/07/2019 1047 by Darreld Mclean, RN Outcome: Progressing 02/07/2019 1014 by Darreld Mclean,  RN Outcome: Progressing   Problem: Pain Managment: Goal: General experience of comfort will improve 02/07/2019 1446 by Darreld Mclean, RN Outcome: Progressing 02/07/2019 1047 by Darreld Mclean, RN Outcome: Progressing 02/07/2019 1014 by Darreld Mclean, RN Outcome: Progressing   Problem: Safety: Goal: Ability to remain free from injury will improve 02/07/2019 1446 by Darreld Mclean, RN Outcome: Progressing 02/07/2019 1047 by Darreld Mclean, RN Outcome: Progressing 02/07/2019 1014 by Darreld Mclean, RN Outcome: Progressing   Problem: Skin Integrity: Goal: Risk for impaired skin integrity will decrease 02/07/2019 1446 by Darreld Mclean, RN Outcome: Progressing 02/07/2019 1047 by Darreld Mclean, RN Outcome: Progressing 02/07/2019 1014 by Darreld Mclean, RN Outcome: Progressing

## 2019-02-07 NOTE — Plan of Care (Signed)

## 2019-02-07 NOTE — Discharge Instructions (Signed)
1) abstinence from methamphetamine and other illegal drugs strongly advised 2) outpatient drug rehabilitation program strongly advised 3) drink plenty fluids 4)Avoid ibuprofen/Advil/Aleve/Motrin/Goody Powders/Naproxen/BC powders/Meloxicam/Diclofenac/Indomethacin and other Nonsteroidal anti-inflammatory medications as these will make you more likely to bleed and can cause stomach ulcers, can also cause Kidney problems.  5) follow-up to primary care physician within a week for recheck

## 2019-02-07 NOTE — Discharge Summary (Signed)
Anthony CleaverJeffrey L Callahan, is a 53 y.o. male  DOB 05/21/1966  MRN [id number].  Admission date:  02/04/2019  Admitting Physician  Meredeth IdeGagan S Lama, MD  Discharge Date:  02/07/2019   Primary MD  Patient, No Pcp Per  Recommendations for primary care physician for things to follow:   1)Abstinence from methamphetamine and other illegal drugs strongly advised 2)Outpatient drug rehabilitation program strongly advised 3) Drink plenty fluids 4)Avoid ibuprofen/Advil/Aleve/Motrin/Goody Powders/Naproxen/BC powders/Meloxicam/Diclofenac/Indomethacin and other Nonsteroidal anti-inflammatory medications as these will make you more likely to bleed and can cause stomach ulcers, can also cause Kidney problems.  5) follow-up to primary care physician within a week for recheck   Admission Diagnosis  Non-traumatic rhabdomyolysis [M62.82]   Discharge Diagnosis  Non-traumatic rhabdomyolysis [M62.82]    Active Problems:   Rhabdomyolysis   Methamphetamine abuse (HCC)   Polysubstance abuse (HCC)      History reviewed. No pertinent past medical history.  History reviewed. No pertinent surgical history.     HPI  from the history and physical done on the day of admission:   Anthony Callahan  is a 53 y.o. male, with history of polysubstance abuse, methamphetamine use who was recently admitted to the hospital for detox and rhabdomyolysis.  Patient left AMA at that time as he had to go back to his job as per patient.  Patient says that his symptoms got worse he has worsening weakness of lower extremities especially hamstrings. Lab work in the ED showed worsening of CK up to 11,251, it was 7786 on 02/03/2019. Denies chest pain or shortness of breath. Complains of nausea but no vomiting Complains of intermittent burning in urination    Hospital Course:    Brief Summary 53 year old with polysubstance abuse especially methamphetamine recently  left AMA after treatment for rhabdomyolysis on 02/03/2020 patient left AMA CKwas4,719, returns now with myalgias and readmitted on 02/05/2019  With CKs up to 11,251, patient admits to ongoing daily methamphetamine use  Plan:- 1)Rhabdomyolysis ---methamphetamine use induced,  much improved with IV hydration, CK down to the 1000 range from over 11,000 on admission, renal function is okay patient should do fine as long as he avoid further methamphetamine use and stays hydrated     2)Polysubstance Abuse including Methamphetamine---will need outpatient rehab, methocarbamol, gabapentin and Atarax as ordered to help blunt withdrawal symptoms  3)Social/Disposition--- patient's living situation is tenuous, he apparently lost the room he was renting from his boss, because he was doing drugs and did not show for work  4)Legal----after discharge when Child psychotherapistsocial worker went to give patient a list of shelters to go to----patient apparently told the social worker that he would rather kill himself because he has no place to go to.  Social worker relayed her concerns to me I went back into the room to talk to patient with RN Annabelle Harmanana..... Patient initially said that he did not threaten to kill himself, subsequently patient said if he is discharged and has no place to go he might become suicidal..  I requested telemetry psychiatry consult--- orders  were placed, as staff where arranging for telemetry psych consult for patient patient apparently left the  Hospital. As per RN Annabelle Harman.... Patient apparently says he was okay  Discharge Condition: Medically stable  Follow UP--- with PCP   Consults obtained - TelePsych   Diet and Activity recommendation:  As advised  Discharge Instructions    Discharge Instructions    Call MD for:  difficulty breathing, headache or visual disturbances   Complete by:  As directed    Call MD for:  persistant dizziness or light-headedness   Complete by:  As directed    Call MD for:   persistant nausea and vomiting   Complete by:  As directed    Call MD for:  severe uncontrolled pain   Complete by:  As directed    Call MD for:  temperature >100.4   Complete by:  As directed    Diet - low sodium heart healthy   Complete by:  As directed    Drink plenty fluids   Discharge instructions   Complete by:  As directed    1) abstinence from methamphetamine and other illegal drugs strongly advised 2) outpatient drug rehabilitation program strongly advised 3) drink plenty fluids 4)Avoid ibuprofen/Advil/Aleve/Motrin/Goody Powders/Naproxen/BC powders/Meloxicam/Diclofenac/Indomethacin and other Nonsteroidal anti-inflammatory medications as these will make you more likely to bleed and can cause stomach ulcers, can also cause Kidney problems.  5) follow-up to primary care physician within a week for recheck   Increase activity slowly   Complete by:  As directed       Discharge Medications     Allergies as of 02/07/2019   No Known Allergies     Medication List    TAKE these medications   gabapentin 600 MG tablet Commonly known as:  NEURONTIN Take 1 tablet (600 mg total) by mouth 3 (three) times daily.   hydrOXYzine 25 MG tablet Commonly known as:  ATARAX/VISTARIL Take 1 tablet (25 mg total) by mouth at bedtime.   methocarbamol 750 MG tablet Commonly known as:  ROBAXIN 1  po tid for spasm around ribs What changed:    medication strength  additional instructions       Major procedures and Radiology Reports - PLEASE review detailed and final reports for all details, in brief -    Dg Chest 2 View  Result Date: 02/05/2019 CLINICAL DATA:  Diffuse muscle ache and spasm EXAM: CHEST - 2 VIEW COMPARISON:  None. FINDINGS: The heart size and mediastinal contours are within normal limits. Mild pulmonary hyperinflation is noted. Both lungs are clear. Healed fracture deformity of the right posterior fifth rib. Nodular densities project over the mid to lower lung bilaterally at  the same level consistent with nipple shadows. Costochondral calcification and cartilage off the anterior right sixth rib is believed to account for ovoid density seen at the right lung base on the frontal view. No mass is apparent on the lateral to account for this finding. IMPRESSION: Mild pulmonary hyperinflation. Electronically Signed   By: Tollie Eth M.D.   On: 02/05/2019 01:38   Today   Subjective    Anthony Callahan today has no new medical concerns, having difficulty finding a place to go to           Patient has been seen and examined prior to discharge   Objective   Blood pressure 133/82, pulse 63, temperature 98.3 F (36.8 C), temperature source Oral, resp. rate 18, height 6\' 2"  (1.88 m), weight 81.6 kg, SpO2 98 %.  Intake/Output Summary (Last 24 hours) at 02/07/2019 1309 Last data filed at 02/07/2019 0600 Gross per 24 hour  Intake 2458.13 ml  Output -  Net 2458.13 ml    Exam Gen:- Awake Alert,  In no apparent distress  HEENT:- [place].AT, No sclera icterus Neck-Supple Neck,No JVD,.  Lungs-  CTAB , fair symmetrical air movement CV- S1, S2 normal, regular  Abd-  +ve B.Sounds, Abd Soft, No tenderness,    Extremity/Skin:- No  edema, pedal pulses present  Psych-affect is appropriate, oriented x3 (patient gets upset from time to time because apparently is having difficulty finding accommodation) Neuro-no new focal deficits, no tremors   Data Review   CBC w Diff:  Lab Results  Component Value Date   WBC 5.2 02/05/2019   HGB 14.1 02/05/2019   HCT 43.0 02/05/2019   PLT 225 02/05/2019   LYMPHOPCT 31 02/05/2019   MONOPCT 8 02/05/2019   EOSPCT 1 02/05/2019   BASOPCT 0 02/05/2019    CMP:  Lab Results  Component Value Date   NA 135 02/05/2019   K 3.5 02/05/2019   CL 104 02/05/2019   CO2 30 02/05/2019   BUN 11 02/05/2019   CREATININE 0.73 02/05/2019   PROT 7.1 02/05/2019   ALBUMIN 4.0 02/05/2019   BILITOT 0.8 02/05/2019   ALKPHOS 61 02/05/2019   AST 241 (H)  02/05/2019   ALT 103 (H) 02/05/2019    Total Discharge time is about 33 minutes  Shon Hale M.D on 02/07/2019 at 1:09 PM  Go to [email or web address] -  for contact info  [facility] - Office  [phone]

## 2019-02-07 NOTE — Progress Notes (Signed)
Patient states at this time he is not having suicidal thoughts and is going to leave.  Advised patient he should wait for his psych evaluation but he is insistent upon leaving. Patient states he is fine and is going to leave.Patient walked to elevator unassisted and left the building

## 2019-02-07 NOTE — Clinical Social Work Note (Signed)
LCSW discussed shelter list with patient and encouraged him to call places as he had not done so. Patient stated that he wanted to go to the [facility] as he had been there before but had no way.  LCSW discussed transportation resources in patient's natural ecology. Patient stated that he only had his father and called his father an "asshole," he went on to say "I don't fuck with him." He identified that his father lives in [place].  LCSW contacted the [facility] and left a message returning contact. LCSW spoke with Chiropodist, Wandra Mannan, who indicated that patient could not be sent to the [facility] without speaking with staff who could ensure they could accept him. He indicated that if staff confirmed a cab could be arranged to the [place] station and an [employer] could be purchased to [place].   Patient called back later and patient indicated that he had called a few places and that he had not received and answer.  Patient then stated that he may as well slit his wrist.  LCSW confirmed with patient that he felt SI. Patient said he was suicidal and may as well lie in the road if he didn't have anywhere to go.  Patient then stated that he is always suicidal.  LCSW notified attending.      Ladana Chavero, Juleen China, LCSW

## 2019-07-28 ENCOUNTER — Emergency Department (HOSPITAL_COMMUNITY): Payer: Self-pay

## 2019-07-28 ENCOUNTER — Encounter (HOSPITAL_COMMUNITY): Payer: Self-pay | Admitting: Emergency Medicine

## 2019-07-28 ENCOUNTER — Emergency Department (HOSPITAL_COMMUNITY)
Admission: EM | Admit: 2019-07-28 | Discharge: 2019-07-29 | Disposition: A | Discharge status: Home / Self Care | Payer: Self-pay | Attending: Emergency Medicine | Admitting: Emergency Medicine

## 2019-07-28 ENCOUNTER — Other Ambulatory Visit: Payer: Self-pay

## 2019-07-28 DIAGNOSIS — F1510 Other stimulant abuse, uncomplicated: Secondary | ICD-10-CM | POA: Insufficient documentation

## 2019-07-28 DIAGNOSIS — F17210 Nicotine dependence, cigarettes, uncomplicated: Secondary | ICD-10-CM | POA: Insufficient documentation

## 2019-07-28 DIAGNOSIS — F322 Major depressive disorder, single episode, severe without psychotic features: Secondary | ICD-10-CM | POA: Insufficient documentation

## 2019-07-28 DIAGNOSIS — R45851 Suicidal ideations: Secondary | ICD-10-CM | POA: Insufficient documentation

## 2019-07-28 DIAGNOSIS — R079 Chest pain, unspecified: Secondary | ICD-10-CM | POA: Diagnosis present

## 2019-07-28 DIAGNOSIS — R740 Nonspecific elevation of levels of transaminase and lactic acid dehydrogenase [LDH]: Secondary | ICD-10-CM | POA: Insufficient documentation

## 2019-07-28 DIAGNOSIS — Z20828 Contact with and (suspected) exposure to other viral communicable diseases: Secondary | ICD-10-CM | POA: Insufficient documentation

## 2019-07-28 DIAGNOSIS — R0789 Other chest pain: Secondary | ICD-10-CM | POA: Insufficient documentation

## 2019-07-28 DIAGNOSIS — F1910 Other psychoactive substance abuse, uncomplicated: Secondary | ICD-10-CM | POA: Insufficient documentation

## 2019-07-28 DIAGNOSIS — F1010 Alcohol abuse, uncomplicated: Secondary | ICD-10-CM | POA: Insufficient documentation

## 2019-07-28 DIAGNOSIS — Z79899 Other long term (current) drug therapy: Secondary | ICD-10-CM | POA: Insufficient documentation

## 2019-07-28 DIAGNOSIS — R7401 Elevation of levels of liver transaminase levels: Secondary | ICD-10-CM

## 2019-07-28 LAB — TROPONIN I (HIGH SENSITIVITY)
Troponin I (High Sensitivity): 3 ng/L (ref <18)
Troponin I (High Sensitivity): 3 ng/L (ref <18)

## 2019-07-28 LAB — ETHANOL: Alcohol, Ethyl (B): <10 mg/dL (ref <10)

## 2019-07-28 LAB — COMPREHENSIVE METABOLIC PANEL
ALT: 74 U/L — ABNORMAL HIGH (ref 0–44)
AST: 57 U/L — ABNORMAL HIGH (ref 15–41)
Albumin: 3.9 g/dL (ref 3.5–5.0)
Alkaline Phosphatase: 52 U/L (ref 38–126)
Anion gap: 7 (ref 5–15)
BUN: 9 mg/dL (ref 6–20)
CO2: 25 mmol/L (ref 22–32)
Calcium: 8.5 mg/dL — ABNORMAL LOW (ref 8.9–10.3)
Chloride: 105 mmol/L (ref 98–111)
Creatinine, Ser: 0.75 mg/dL (ref 0.61–1.24)
GFR calc Af Amer: >60 mL/min (ref >60)
GFR calc non Af Amer: >60 mL/min (ref >60)
Glucose, Bld: 94 mg/dL (ref 70–99)
Potassium: 3.7 mmol/L (ref 3.5–5.1)
Sodium: 137 mmol/L (ref 135–145)
Total Bilirubin: 0.9 mg/dL (ref 0.3–1.2)
Total Protein: 7.4 g/dL (ref 6.5–8.1)

## 2019-07-28 LAB — CBC WITH DIFFERENTIAL/PLATELET
Abs Immature Granulocytes: 0 10*3/uL (ref 0.00–0.07)
Basophils Absolute: 0 10*3/uL (ref 0.0–0.1)
Basophils Relative: 0 %
Eosinophils Absolute: 0.1 10*3/uL (ref 0.0–0.5)
Eosinophils Relative: 2 %
HCT: 41.9 % (ref 39.0–52.0)
Hemoglobin: 13.6 g/dL (ref 13.0–17.0)
Immature Granulocytes: 0 %
Lymphocytes Relative: 39 %
Lymphs Abs: 1.4 10*3/uL (ref 0.7–4.0)
MCH: 32.3 pg (ref 26.0–34.0)
MCHC: 32.5 g/dL (ref 30.0–36.0)
MCV: 99.5 fL (ref 80.0–100.0)
Monocytes Absolute: 0.3 10*3/uL (ref 0.1–1.0)
Monocytes Relative: 9 %
Neutro Abs: 1.9 10*3/uL (ref 1.7–7.7)
Neutrophils Relative %: 50 %
Platelets: 200 10*3/uL (ref 150–400)
RBC: 4.21 MIL/uL — ABNORMAL LOW (ref 4.22–5.81)
RDW: 13.5 % (ref 11.5–15.5)
WBC: 3.7 10*3/uL — ABNORMAL LOW (ref 4.0–10.5)
nRBC: 0 % (ref 0.0–0.2)

## 2019-07-28 LAB — RAPID URINE DRUG SCREEN, HOSP PERFORMED
Amphetamines: POSITIVE — AB
Barbiturates: NOT DETECTED
Benzodiazepines: NOT DETECTED
Cocaine: NOT DETECTED
Opiates: NOT DETECTED
Tetrahydrocannabinol: NOT DETECTED

## 2019-07-28 LAB — SARS CORONAVIRUS 2 BY RT PCR (HOSPITAL ORDER, PERFORMED IN ~~LOC~~ HOSPITAL LAB): SARS Coronavirus 2: NEGATIVE

## 2019-07-28 MED ORDER — ASPIRIN 81 MG PO CHEW
324.0000 mg | CHEWABLE_TABLET | Freq: Once | ORAL | Status: AC
  Administered 2019-07-28: 01:00:00 324 mg via ORAL
  Filled 2019-07-28: qty 4

## 2019-07-28 MED ORDER — ONDANSETRON HCL 4 MG PO TABS: 4.0000 mg | ORAL_TABLET | Freq: Three times a day (TID) | ORAL | Status: DC | PRN

## 2019-07-28 MED ORDER — NICOTINE 14 MG/24HR TD PT24
14.0000 mg | MEDICATED_PATCH | Freq: Every day | TRANSDERMAL | Status: DC
  Administered 2019-07-28: 11:00:00 14 mg via TRANSDERMAL
  Filled 2019-07-28: qty 1

## 2019-07-28 MED ORDER — ALUM & MAG HYDROXIDE-SIMETH 200-200-20 MG/5ML PO SUSP: 30.0000 mL | Freq: Four times a day (QID) | ORAL | Status: DC | PRN

## 2019-07-28 MED ORDER — ACETAMINOPHEN 325 MG PO TABS: 650.0000 mg | ORAL_TABLET | ORAL | Status: DC | PRN

## 2019-07-28 MED ORDER — ZOLPIDEM TARTRATE 5 MG PO TABS
10.0000 mg | ORAL_TABLET | Freq: Once | ORAL | Status: AC
  Administered 2019-07-28: 20:00:00 10 mg via ORAL
  Filled 2019-07-28: qty 2

## 2019-07-28 NOTE — ED Provider Notes (Signed)
[facility] EMERGENCY DEPARTMENT Provider Note   CSN: [id number] Arrival date & time: 07/28/19  0008    History   Chief Complaint Chief Complaint  Patient presents with  . Chest Pain  . Shortness of Breath    HPI Anthony Callahan is a 53 y.o. male.   The history is provided by the patient.  He has history of polysubstance abuse and comes in because of an episode of chest tightness which occurred about 2 hours ago.  It has since resolved but he will not tell me how long it lasted.  There was associated dyspnea, nausea, diaphoresis.  There was no associated vomiting.  He does admit to using methamphetamine earlier today.  He also states that he drinks a 12 pack of beer a day, and wants to go through detox.  He does admit to depression and vague suicidal ideation without a specific plan.  He is a cigarette smoker but denies history of diabetes, hypertension, hyperlipidemia and denies family history of premature coronary atherosclerosis.  History reviewed. No pertinent past medical history.  Patient Active Problem List   Diagnosis Date Noted  . Methamphetamine abuse ([place]) 02/06/2019  . Polysubstance abuse ([place]) 02/06/2019  . Rhabdomyolysis 02/03/2019    History reviewed. No pertinent surgical history.      Home Medications    Prior to Admission medications   Medication Sig Start Date End Date Taking? Authorizing Provider  gabapentin (NEURONTIN) 600 MG tablet Take 1 tablet (600 mg total) by mouth 3 (three) times daily. 02/07/19   Roxan Hockey, MD  hydrOXYzine (ATARAX/VISTARIL) 25 MG tablet Take 1 tablet (25 mg total) by mouth at bedtime. 02/07/19   Roxan Hockey, MD  methocarbamol (ROBAXIN) 750 MG tablet 1  po tid for spasm around ribs 02/07/19   Roxan Hockey, MD    Family History No family history on file.  Social History Social History   Tobacco Use  . Smoking status: Current Every Day Smoker    Packs/day: 1.00    Types: Cigarettes  . Smokeless tobacco: Never  Used  Substance Use Topics  . Alcohol use: Yes    Comment: 12 pk/day  . Drug use: Yes    Types: Methamphetamines    Comment: meth last use was x 3-4 hours ago     Allergies   Patient has no known allergies.   Review of Systems Review of Systems  All other systems reviewed and are negative.    Physical Exam Updated Vital Signs BP 128/82   Pulse 78   Temp 98.5 F (36.9 C) (Oral)   Resp 17   Ht 6\' 2"  (1.88 m)   Wt 72.6 kg   SpO2 100%   BMI 20.54 kg/m   Physical Exam Vitals signs and nursing note reviewed.    53 year old male, resting comfortably and in no acute distress. Vital signs are normal. Oxygen saturation is 100%, which is normal. Head is normocephalic and atraumatic. PERRLA, EOMI. Oropharynx is clear. Neck is nontender and supple without adenopathy or JVD. Back is nontender and there is no CVA tenderness. Lungs are clear without rales, wheezes, or rhonchi. Chest is nontender. Heart has regular rate and rhythm without murmur. Abdomen is soft, flat, nontender without masses or hepatosplenomegaly and peristalsis is normoactive. Extremities have no cyanosis or edema, full range of motion is present. Skin is warm and dry without rash. Neurologic: Mental status is normal, cranial nerves are intact, there are no motor or sensory deficits.  ED Treatments /  Results  Labs (all labs ordered are listed, but only abnormal results are displayed) Labs Reviewed  CBC WITH DIFFERENTIAL/PLATELET - Abnormal; Notable for the following components:      Result Value   WBC 3.7 (*)    RBC 4.21 (*)    All other components within normal limits  COMPREHENSIVE METABOLIC PANEL - Abnormal; Notable for the following components:   Calcium 8.5 (*)    AST 57 (*)    ALT 74 (*)    All other components within normal limits  RAPID URINE DRUG SCREEN, HOSP PERFORMED - Abnormal; Notable for the following components:   Amphetamines POSITIVE (*)    All other components within normal limits   ETHANOL  TROPONIN I (HIGH SENSITIVITY)  TROPONIN I (HIGH SENSITIVITY)    EKG EKG Interpretation  Date/Time:  Thursday July 28 2019 00:12:26 EDT Ventricular Rate:  77 PR Interval:    QRS Duration: 84 QT Interval:  378 QTC Calculation: 428 R Axis:   64 Text Interpretation:  Sinus rhythm Normal ECG When compared with ECG of 02/05/2019, No significant change was found Confirmed by Dione BoozeGlick, Lorrain Rivers ([id number]) on 07/28/2019 12:14:46 AM   Radiology Dg Chest 2 View  Result Date: 07/28/2019 CLINICAL DATA:  Chest pain EXAM: CHEST - 2 VIEW COMPARISON:  02/05/2019 FINDINGS: Hyperinflation without focal opacity, pleural effusion or pneumothorax. Stable cardiomediastinal silhouette. IMPRESSION: No active cardiopulmonary disease.  Pulmonary hyperinflation. Electronically Signed   By: Jasmine PangKim  Fujinaga M.D.   On: 07/28/2019 01:32    Procedures Procedures   Medications Ordered in ED Medications - No data to display   Initial Impression / Assessment and Plan / ED Course  I have reviewed the triage vital signs and the nursing notes.  Pertinent labs & imaging results that were available during my care of the patient were reviewed by me and considered in my medical decision making (see chart for details).  Chest pain which has resolved.  He has minimal risk factors for coronary artery disease but will screen with troponin.  ECG is unremarkable.  He is given a dose of aspirin.  Old records are reviewed, and he has no relevant past visits.  Once he has ruled out for ACS, will obtain TTS consultation.  Heart score is 2 which puts him at low risk for major adverse cardiac events in the next 6 weeks.  Troponin is negative x2, ACS is ruled out.  Other labs are significant for mild leukopenia and mild elevation of transaminases which are actually lower than they had been recently.  Alcohol is not detected.  He is considered to be medically cleared for psychiatric evaluation.  TTS consultation is appreciated.   Patient meets inpatient criteria.  He is being held in the ED pending appropriate placement.  Final Clinical Impressions(s) / ED Diagnoses   Final diagnoses:  Chest discomfort  Amphetamine abuse (HCC)  Alcohol abuse  Elevated transaminase level  Current severe episode of major depressive disorder without psychotic features, unspecified whether recurrent (HCC)  Suicidal ideation  Polysubstance abuse [facility](HCC)    ED Discharge Orders    None       Dione BoozeGlick, Rucha Wissinger, MD 07/28/19 [phone]

## 2019-07-28 NOTE — ED Notes (Signed)
[place] called and stated [facility] is looking at pt and is requesting pt has covid swab before they will accept pt.

## 2019-07-28 NOTE — Progress Notes (Signed)
Referred to: [facility]  Also considered for admission to [facility].  Sharren Bridge, MSW, LCSW Transitions of Care 07/28/2019 [phone]

## 2019-07-28 NOTE — ED Notes (Signed)
Have called [facility]. Stated they will call me back with information

## 2019-07-28 NOTE — ED Notes (Signed)
Pt requesting help with detox / "drying out"

## 2019-07-28 NOTE — ED Triage Notes (Addendum)
[facility] - pt c/o SOB and CP x 2 hours. Pt states "I think its just anxiety." Pt states that the SOB and CP started after he did meth. 18g IV R FA. EKG and VS unremarkable per EMS

## 2019-07-28 NOTE — BH Assessment (Signed)
Tele Assessment Note   Patient Name: Anthony Callahan MRN: [id number] Referring Physician: Dr. Delora Fuel Location of Patient: [facility] Location of Provider: [place] is an 53 y.o. male presenting with polysubstance abuse and SI with plan to cut self with knife. Patient originally seen for chest pain then requesting detox from alcohol and meth. During assessment patient reported not having any chest pains. Patient admitted to using 1 gram of of meth daily and a 12 pack of beer whenever he can get it. Patient reported using meth for past 6 years.Patient reported not having a support system. Patient reported being homeless. Patient reported access to knives and gun. Patient reported 2 past suicide attempts in the past, along with current self-harming behaviors like punching himself. Patient reported prior inpatient treatment. Patient reported seeing people and objects, no command hallucinations. Patient reported 2 hours of sleep and poor appetite. Patient denied HI and psychosis. Patient reported he is not currently seeing anyone for outpatient therapy.   Patient reported being unemployment and homeless.   Patient reported having access to guns and knives that he has access to.  Diagnosis: Major depressive disorder  Past Medical History: History reviewed. No pertinent past medical history.  History reviewed. No pertinent surgical history.  Family History: No family history on file.  Social History:  reports that he has been smoking cigarettes. He has been smoking about 1.00 pack per day. He has never used smokeless tobacco. He reports current alcohol use. He reports current drug use. Drug: Methamphetamines.  Additional Social History:  Alcohol / Drug Use Pain Medications: see MAR Prescriptions: see MAR Over the Counter: see MAR  CIWA: CIWA-Ar BP: 107/81 Pulse Rate: 64 COWS:    Allergies: No Known Allergies  Home Medications: (Not in a hospital  admission)   OB/GYN Status:  No LMP for male patient.  General Assessment Data Location of Assessment: [facility] TTS Assessment: In system Is this a Tele or Face-to-Face Assessment?: Tele Assessment Is this an Initial Assessment or a Re-assessment for this encounter?: Initial Assessment Patient Accompanied by:: N/A Language Other than English: No Living Arrangements: Homeless/Shelter What gender do you identify as?: Male Marital status: Single Living Arrangements: Alone Can pt return to current living arrangement?: Yes Admission Status: Voluntary Is patient capable of signing voluntary admission?: Yes Referral Source: Self/Family/Friend   Crisis Care Plan Living Arrangements: Alone Legal Guardian: (self) Name of Psychiatrist: (none) Name of Therapist: (none)  Education Status Is patient currently in school?: No Is the patient employed, unemployed or receiving disability?: Receiving disability income  Risk to self with the past 6 months Suicidal Ideation: Yes-Currently Present Has patient been a risk to self within the past 6 months prior to admission? : No Suicidal Intent: Yes-Currently Present Has patient had any suicidal intent within the past 6 months prior to admission? : No Is patient at risk for suicide?: Yes Suicidal Plan?: Yes-Currently Present Has patient had any suicidal plan within the past 6 months prior to admission? : No Specify Current Suicidal Plan: (cut self with knife) Access to Means: Yes Specify Access to Suicidal Means: (access to knives) What has been your use of drugs/alcohol within the last 12 months?: (meth and alcohol) Previous Attempts/Gestures: Yes How many times?: (2) Other Self Harm Risks: (none) Triggers for Past Attempts: Unpredictable Intentional Self Injurious Behavior: Bruising(punching self) Comment - Self Injurious Behavior: (punched self this morning) Family Suicide History: No Recent stressful life event(s): Financial  Problems(homeless and drug  usage) Persecutory voices/beliefs?: No Depression: Yes Depression Symptoms: Guilt, Feeling worthless/self pity, Loss of interest in usual pleasures, Fatigue Substance abuse history and/or treatment for substance abuse?: No Suicide prevention information given to non-admitted patients: Not applicable  Risk to Others within the past 6 months Homicidal Ideation: No Does patient have any lifetime risk of violence toward others beyond the six months prior to admission? : No Thoughts of Harm to Others: No Current Homicidal Intent: No Current Homicidal Plan: No Access to Homicidal Means: No Identified Victim: (none) History of harm to others?: No Assessment of Violence: None Noted Violent Behavior Description: (none) Does patient have access to weapons?: No Criminal Charges Pending?: No Does patient have a court date: No Is patient on probation?: No  Psychosis Hallucinations: None noted Delusions: None noted  Mental Status Report Appearance/Hygiene: Unremarkable Eye Contact: Poor Motor Activity: Freedom of movement Speech: Logical/coherent Level of Consciousness: Alert Mood: Depressed, Anxious Affect: Anxious, Depressed Anxiety Level: Minimal Thought Processes: Relevant Judgement: Partial Orientation: Person, Place, Time, Situation Obsessive Compulsive Thoughts/Behaviors: None  Cognitive Functioning Concentration: Fair Memory: Recent Intact Is patient IDD: No Insight: Poor Impulse Control: Poor Appetite: Poor Have you had any weight changes? : No Change Sleep: Decreased Total Hours of Sleep: (2) Vegetative Symptoms: None  ADLScreening [facility] Assessment Services) Patient's cognitive ability adequate to safely complete daily activities?: Yes Patient able to express need for assistance with ADLs?: Yes Independently performs ADLs?: Yes (appropriate for developmental age)  Prior Inpatient Therapy Prior Inpatient Therapy: No  Prior Outpatient  Therapy Prior Outpatient Therapy: No Does patient have an ACCT team?: No Does patient have Intensive In-House Services?  : No Does patient have [facility] services? : No Does patient have P4CC services?: No  ADL Screening (condition at time of admission) Patient's cognitive ability adequate to safely complete daily activities?: Yes Patient able to express need for assistance with ADLs?: Yes Independently performs ADLs?: Yes (appropriate for developmental age)  Merchant navy officerAdvance Directives (For Healthcare) Does Patient Have a Medical Advance Directive?: No   Disposition:  Disposition Initial Assessment Completed for this Encounter: Yes  Sherron Flemingsashawn Dixon, NP, patient meets inpatient criteria. TTS to secure placement.  This service was provided via telemedicine using a 2-way, interactive audio and video technology.  Names of all persons participating in this telemedicine service and their role in this encounter. Name: Duncan DullJeffrey Doring Role: Patient  Name: TTS Clinician Role: TTS Clinician  Name:  Role:   Name:  Role:     Burnetta SabinLatisha D Mairany Bruno 07/28/2019 5:28 AM

## 2019-07-28 NOTE — ED Notes (Signed)
TTS in progress 

## 2019-07-29 ENCOUNTER — Encounter (HOSPITAL_COMMUNITY): Payer: Self-pay

## 2019-07-29 ENCOUNTER — Inpatient Hospital Stay (HOSPITAL_COMMUNITY)
Admission: AD | Admit: 2019-07-29 | Discharge: 2019-08-03 | DRG: 885 | Disposition: A | Discharge status: Home / Self Care | Payer: Federal, State, Local not specified - Other | Source: Intra-hospital | Attending: Psychiatry | Admitting: Psychiatry

## 2019-07-29 ENCOUNTER — Other Ambulatory Visit: Payer: Self-pay | Admitting: Family

## 2019-07-29 ENCOUNTER — Other Ambulatory Visit: Payer: Self-pay

## 2019-07-29 DIAGNOSIS — Z590 Homelessness: Secondary | ICD-10-CM

## 2019-07-29 DIAGNOSIS — F1920 Other psychoactive substance dependence, uncomplicated: Secondary | ICD-10-CM

## 2019-07-29 DIAGNOSIS — F332 Major depressive disorder, recurrent severe without psychotic features: Secondary | ICD-10-CM | POA: Diagnosis present

## 2019-07-29 DIAGNOSIS — G4700 Insomnia, unspecified: Secondary | ICD-10-CM | POA: Diagnosis present

## 2019-07-29 DIAGNOSIS — R45851 Suicidal ideations: Secondary | ICD-10-CM | POA: Diagnosis present

## 2019-07-29 DIAGNOSIS — F1594 Other stimulant use, unspecified with stimulant-induced mood disorder: Secondary | ICD-10-CM | POA: Diagnosis present

## 2019-07-29 DIAGNOSIS — F17210 Nicotine dependence, cigarettes, uncomplicated: Secondary | ICD-10-CM | POA: Diagnosis present

## 2019-07-29 DIAGNOSIS — F419 Anxiety disorder, unspecified: Secondary | ICD-10-CM | POA: Diagnosis present

## 2019-07-29 DIAGNOSIS — Z20828 Contact with and (suspected) exposure to other viral communicable diseases: Secondary | ICD-10-CM | POA: Diagnosis present

## 2019-07-29 MED ORDER — ACETAMINOPHEN 325 MG PO TABS: 650.0000 mg | ORAL_TABLET | Freq: Four times a day (QID) | ORAL | Status: DC | PRN

## 2019-07-29 MED ORDER — HYDROXYZINE HCL 25 MG PO TABS
25.0000 mg | ORAL_TABLET | Freq: Three times a day (TID) | ORAL | Status: DC | PRN
  Administered 2019-07-30 – 2019-08-01 (×4): 25 mg via ORAL
  Filled 2019-07-29: qty 10
  Filled 2019-07-29 (×4): qty 1

## 2019-07-29 MED ORDER — ALUM & MAG HYDROXIDE-SIMETH 200-200-20 MG/5ML PO SUSP
30.0000 mL | ORAL | Status: DC | PRN
  Administered 2019-08-01: 30 mL via ORAL
  Filled 2019-07-29: qty 30

## 2019-07-29 MED ORDER — MAGNESIUM HYDROXIDE 400 MG/5ML PO SUSP: 30.0000 mL | Freq: Every day | ORAL | Status: DC | PRN

## 2019-07-29 NOTE — Tx Team (Signed)
Initial Treatment Plan 07/29/2019 11:14 PM [name] [id number]:[id number]    PATIENT STRESSORS: Financial difficulties Occupational concerns Substance abuse   PATIENT STRENGTHS: Active sense of humor Capable of independent living Work skills   PATIENT IDENTIFIED PROBLEMS: Substance abuse  Depression  "Stay clean"  "longterm program"               DISCHARGE CRITERIA:  Ability to meet basic life and health needs Adequate post-discharge living arrangements Improved stabilization in mood, thinking, and/or behavior Medical problems require only outpatient monitoring Motivation to continue treatment in a less acute level of care  PRELIMINARY DISCHARGE PLAN: Attend aftercare/continuing care group Attend PHP/IOP Attend 12-step recovery group Outpatient therapy  PATIENT/FAMILY INVOLVEMENT: This treatment plan has been presented to and reviewed with the patient, Anthony Callahan, and/or family member.  The patient and family have been given the opportunity to ask questions and make suggestions.  Wolfgang Phoenix, RN 07/29/2019, 11:14 PM

## 2019-07-29 NOTE — ED Notes (Signed)
Assisted pt to [facility]. Pt left with [facility].

## 2019-07-29 NOTE — ED Notes (Addendum)
[facility] bed 301-1 Accepting: Silvio Pate, NP Attending: Dr Mallie Darting  Call report to [phone] Can go after 3 pm today.

## 2019-07-29 NOTE — ED Notes (Signed)
Called [facility] for transport to [facility]. 

## 2019-07-29 NOTE — Progress Notes (Signed)
Anthony Callahan is a 53 y.o. male Voluntary admitted for suicide ideation and substance abuse. Pt stated he uses meth, marijuana and alcohol. Pt stated he came to the hospital for detox and to be able to find lon gterm treatment. Pt is currently homeless and does not want to go back on the streets after discharge. 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 Pt has been calm and cooperative with admission process, denied SI/HI, AVH and verbally contracted for safety. Consents signed, skin/belongings search completed and pt oriented to unit. Pt stable at this time. Pt given the opportunity to express concerns and ask questions. Pt given toiletries. Will continue to monitor.

## 2019-07-29 NOTE — Progress Notes (Signed)
Notified Kaitlyn RN that [facility] has been assigned to room 305-1 and attending will be Dr. Mallie Darting.  Report to be called [phone] after 3pm.

## 2019-07-30 DIAGNOSIS — F1920 Other psychoactive substance dependence, uncomplicated: Secondary | ICD-10-CM

## 2019-07-30 MED ORDER — VITAMIN B-1 100 MG PO TABS
100.0000 mg | ORAL_TABLET | Freq: Every day | ORAL | Status: DC
  Administered 2019-07-30 – 2019-08-03 (×5): 100 mg via ORAL
  Filled 2019-07-30 (×7): qty 1

## 2019-07-30 MED ORDER — GABAPENTIN 100 MG PO CAPS
200.0000 mg | ORAL_CAPSULE | Freq: Three times a day (TID) | ORAL | Status: DC
  Administered 2019-07-30 – 2019-08-01 (×7): 200 mg via ORAL
  Filled 2019-07-30 (×11): qty 2

## 2019-07-30 MED ORDER — FOLIC ACID 1 MG PO TABS
1.0000 mg | ORAL_TABLET | Freq: Every day | ORAL | Status: DC
  Administered 2019-07-30 – 2019-08-03 (×5): 1 mg via ORAL
  Filled 2019-07-30 (×7): qty 1

## 2019-07-30 MED ORDER — TRAZODONE HCL 50 MG PO TABS
50.0000 mg | ORAL_TABLET | Freq: Every evening | ORAL | Status: DC | PRN
  Administered 2019-07-30: 21:00:00 50 mg via ORAL
  Filled 2019-07-30: qty 1

## 2019-07-30 MED ORDER — CHLORDIAZEPOXIDE HCL 25 MG PO CAPS: 25.0000 mg | ORAL_CAPSULE | Freq: Four times a day (QID) | ORAL | Status: DC | PRN

## 2019-07-30 MED ORDER — LORAZEPAM 1 MG PO TABS
1.0000 mg | ORAL_TABLET | Freq: Once | ORAL | Status: AC
  Administered 2019-07-30: 04:00:00 1 mg via ORAL
  Filled 2019-07-30: qty 1
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
## 2019-07-30 NOTE — Progress Notes (Signed)
DAR NOTE: Patient presents with anxious affect and depressed mood.  Described energy level as low and concentration as poor.  Denies suicidal thoughts, pain, auditory and visual hallucinations.  Rates depression at 10, hopelessness at 0, and anxiety at 10.  Maintained on routine safety checks.  Medications given as prescribed.  Support and encouragement offered as needed.  States goal for today is "get something to eat."  Patient is withdrawn and isolates to his room for majority of this shift. Offered no complaint.

## 2019-07-30 NOTE — Progress Notes (Signed)
7p-9p Pt presented with a flat affect and a depressed mood.Pt rated depression 8/10 and anxiety 8/10. Pt denied AVH. Pt denied SI/HI. Pt denied having any withdrawal symptoms.  Shift assessment completed.   No concerns verbalized by the pt during the assessment.
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
## 2019-07-30 NOTE — [facility] Group Notes (Signed)
[facility] Group Notes: (Clinical Social Work)   07/30/2019      Type of Therapy:  Group Therapy   Participation Level:  Did Not Attend despite MHT prompting   Jabarie Pop N Tyiana Hill, LCSW  07/30/2019 12:37 PM    

## 2019-07-30 NOTE — [facility] Group Notes (Signed)
[facility] Group Notes:  (Nursing/MHT/Case Management/Adjunct)  Date:  07/30/2019  Time:  1330  Type of Therapy:  Nurse Education  Participation Level:  Did Not Attend   Anthony Callahan L 07/30/2019, 

## 2019-07-30 NOTE — Progress Notes (Signed)
Pt's roommate has been in and out of bathroom for a few hours according to staff.  Roommate has come out in hallway and made threatening statements such as "y'all need to get that motherfucker out of my room."  In turn, pt has become irritable and anxious.  He states "I have a high tolerance, but this is something else."  Verbal de-escalation attempted.  Actively listened to pt.  Peer is antagonizing this pt.  On-site provider notified and Ativan 1 mg POX1 was ordered and administered.  PRN medication administered for anxiety.  Pt expressed appreciation.  Pt is safe.  Will continue to monitor and assess.

## 2019-07-31 DIAGNOSIS — F332 Major depressive disorder, recurrent severe without psychotic features: Principal | ICD-10-CM

## 2019-07-31 LAB — LIPID PANEL
Cholesterol: 121 mg/dL (ref 0–200)
HDL: 42 mg/dL (ref >40)
LDL Cholesterol: 59 mg/dL (ref 0–99)
Total CHOL/HDL Ratio: 2.9 RATIO
Triglycerides: 101 mg/dL (ref <150)
VLDL: 20 mg/dL (ref 0–40)

## 2019-07-31 LAB — TSH: TSH: 0.442 u[IU]/mL (ref 0.350–4.500)

## 2019-07-31 LAB — HEMOGLOBIN A1C
Hgb A1c MFr Bld: 5.4 % (ref 4.8–5.6)
Mean Plasma Glucose: 108.28 mg/dL

## 2019-07-31 MED ORDER — FLUOXETINE HCL 20 MG PO CAPS
20.0000 mg | ORAL_CAPSULE | Freq: Every day | ORAL | Status: DC
  Administered 2019-07-31 – 2019-08-03 (×4): 20 mg via ORAL
  Filled 2019-07-31 (×7): qty 1

## 2019-07-31 MED ORDER — TRAZODONE HCL 100 MG PO TABS
100.0000 mg | ORAL_TABLET | Freq: Every day | ORAL | Status: DC
  Filled 2019-07-31 (×2): qty 1

## 2019-07-31 MED ORDER — NICOTINE 21 MG/24HR TD PT24
21.0000 mg | MEDICATED_PATCH | Freq: Every day | TRANSDERMAL | Status: DC
  Administered 2019-07-31 – 2019-08-03 (×4): 21 mg via TRANSDERMAL
  Filled 2019-07-31 (×4): qty 1

## 2019-07-31 MED ORDER — QUETIAPINE FUMARATE 100 MG PO TABS
100.0000 mg | ORAL_TABLET | Freq: Every day | ORAL | Status: DC
  Administered 2019-07-31: 100 mg via ORAL
  Filled 2019-07-31: qty 1

## 2019-07-31 MED ORDER — QUETIAPINE FUMARATE 100 MG PO TABS
ORAL_TABLET | ORAL | Status: AC
  Filled 2019-07-31: qty 1

## 2019-07-31 NOTE — Progress Notes (Signed)
Adult Psychoeducational Group Note  Date:  07/31/2019 Time:  10:54 PM  Group Topic/Focus:  Wrap-Up Group:   The focus of this group is to help patients review their daily goal of treatment and discuss progress on daily workbooks.  Participation Level:  Active  Participation Quality:  Appropriate  Affect:  Appropriate  Cognitive:  Appropriate  Insight: Appropriate  Engagement in Group:  Engaged  Modes of Intervention:  Discussion  Additional Comments:   Pt attended group and participated     [place] A 07/31/2019, 10:54 PM

## 2019-07-31 NOTE — [facility] Group Notes (Signed)
[facility] LCSW Group Therapy Note  Date/Time:  07/31/2019 9:00-10:00 or 10:00-11:00AM  Type of Therapy and Topic:  Group Therapy:  Healthy and Unhealthy Supports  Participation Level:  Did Not Attend   Description of Group:  Patients in this group were introduced to the idea of adding a variety of healthy supports to address the various needs in their lives.Patients discussed what additional healthy supports could be helpful in their recovery and wellness after discharge in order to prevent future hospitalizations.   An emphasis was placed on using counselor, doctor, therapy groups, 12-step groups, and problem-specific support groups to expand supports.  They also worked as a group on developing a specific plan for several patients to deal with unhealthy supports through boundary-setting, psychoeducation with loved ones, and even termination of relationships.   Therapeutic Goals:   1)  discuss importance of adding supports to stay well once out of the hospital  2)  compare healthy versus unhealthy supports and identify some examples of each  3)  generate ideas and descriptions of healthy supports that can be added  4)  offer mutual support about how to address unhealthy supports  5)  encourage active participation in and adherence to discharge plan    Summary of Patient Progress:  The patient did not attend Therapeutic Modalities:   Motivational Interviewing Brief Solution-Focused Therapy  Rainen Vanrossum D Euna Armon         

## 2019-07-31 NOTE — Progress Notes (Signed)
D.  Pt pleasant but depressed on approach, complaint of feeling tired and not getting good sleep.  Pt requested change to Seroquel 100 mg at HS instead of Trazodone.  Pt in room most of shift, minimal interaction at this time.  Pt denies SI/HI/AVH.  A.  Support and encouragement offered, spoke with NP and order given to discontinue Trazodone and begin Seroquel tonight.  Medications given as ordered.  R. Pt remains safe on the unit.

## 2019-07-31 NOTE — Progress Notes (Signed)
D. Pt presents as flat, depressed- somewhat guarded- minimal interactions with peers. Pt currently denies SI/HI and AVH A. Labs and vitals monitored. Pt compliant with medications. Pt supported emotionally and encouraged to express concerns and ask questions.   R. Pt remains safe with 15 minute checks. Will continue POC.

## 2019-07-31 NOTE — Progress Notes (Signed)
[facility] NOVEL CORONAVIRUS (COVID-19) DAILY CHECK-OFF SYMPTOMS - answer yes or no to each - every day NO YES  Have you had a fever in the past 24 hours?  . Fever (Temp > 37.80C / 100F) X   Have you had any of these symptoms in the past 24 hours? . New Cough .  Sore Throat  .  Shortness of Breath .  Difficulty Breathing .  Unexplained Body Aches   X   Have you had any one of these symptoms in the past 24 hours not related to allergies?   . Runny Nose .  Nasal Congestion .  Sneezing   X   If you have had runny nose, nasal congestion, sneezing in the past 24 hours, has it worsened?  X   EXPOSURES - check yes or no X   Have you traveled outside the state in the past 14 days?  X   Have you been in contact with someone with a confirmed diagnosis of COVID-19 or PUI in the past 14 days without wearing appropriate PPE?  X   Have you been living in the same home as a person with confirmed diagnosis of COVID-19 or a PUI (household contact)?    X   Have you been diagnosed with COVID-19?    X              What to do next: Answered NO to all: Answered YES to anything:   Proceed with unit schedule Follow the [facility] Inpatient Flowsheet.   

## 2019-07-31 NOTE — Progress Notes (Signed)
Anthony Callahan Progress Note  07/31/2019 12:49 PM Anthony Callahan  MRN:  [id number]  Subjective: Anthony Callahan reports, "I'm still very depressed. I did not sleep well last night. I don't know how I am doing on the medicines because I have not felt any better. I don't feel like attending any group session because I don't feel well".  Objective: Patient is a 53 year old male with a longstanding past psychiatric history significant for polysubstance dependence who presented to the [facility] emergency room on 07/28/2019 with suicidal ideation. He initially presented there with chest pain, and admitted to using methamphetamines daily as well as a 12 pack of beer whenever "I can get it". While he was in the emergency room when his medical work-up was negative he stated he was suicidal. He stated that he had had 2 previous suicide attempts in the past. He stated he had been in substance rehabilitation in the past. Anthony Callahan is seen, chart reviewed. The chart findings discussed with the treatment. He is seen in his room, while lying down in his bed. He presents alert, oriented & aware of situation. He is verbally responsive making fair eye contact with a flat affect. He is endorsing being depressed. He says he does not yet know how he is doing or feeling as of yet. He is not yet attending any group sessions. He does come out of his room for meals & to take his medicines. He denies any side effects. He denies any SIHI, AVH, delusional thoughts or paranoia. He does not appear to be responding to any internal stimuli. Will initiate Fluoxetine 20 mg po daily for symptoms of depression & increased Trazodone from 50 mg prn to 100 mg po Q hs ruotinely.  Principal Problem: MDD (major depressive disorder), recurrent episode, severe (HCC)  Diagnosis: Principal Problem:   MDD (major depressive disorder), recurrent episode, severe (HCC) Active Problems:   Polysubstance dependence (HCC)  Total Time spent with patient: 25  minutes  Past Psychiatric History: Polysubstance use disorder  Past Medical History: History reviewed. No pertinent past medical history. History reviewed. No pertinent surgical history.  Family History: History reviewed. No pertinent family history.  Family Psychiatric  History: See H&P  Social History:  Social History   Substance and Sexual Activity  Alcohol Use Yes   Comment: 12 pk/day     Social History   Substance and Sexual Activity  Drug Use Yes  . Types: Methamphetamines   Comment: meth last use was x 3-4 hours ago    Social History   Socioeconomic History  . Marital status: Legally Separated    Spouse name: Not on file  . Number of children: Not on file  . Years of education: Not on file  . Highest education level: Not on file  Occupational History  . Not on file  Social Needs  . Financial resource strain: Not on file  . Food insecurity    Worry: Not on file    Inability: Not on file  . Transportation needs    Medical: Not on file    Non-medical: Not on file  Tobacco Use  . Smoking status: Current Every Day Smoker    Packs/day: 1.00    Types: Cigarettes  . Smokeless tobacco: Never Used  Substance and Sexual Activity  . Alcohol use: Yes    Comment: 12 pk/day  . Drug use: Yes    Types: Methamphetamines    Comment: meth last use was x 3-4 hours ago  . Sexual activity: Not  on file  Lifestyle  . Physical activity    Days per week: Not on file    Minutes per session: Not on file  . Stress: Not on file  Relationships  . Social Musician on phone: Not on file    Gets together: Not on file    Attends religious service: Not on file    Active member of club or organization: Not on file    Attends meetings of clubs or organizations: Not on file    Relationship status: Not on file  Other Topics Concern  . Not on file  Social History Narrative  . Not on file   Additional Social History:    Pain Medications: See MAR Prescriptions: See  MAR Over the Counter: See MAR History of alcohol / drug use?: Yes Longest period of sobriety (when/how long): unkwon Negative Consequences of Use: Financial  Sleep: Poor  Appetite:  Fair  Current Medications: Current Facility-Administered Medications  Medication Dose Route Frequency Provider Last Rate Last Dose  . acetaminophen (TYLENOL) tablet 650 mg  650 mg Oral Q6H PRN Maryagnes Amos, FNP      . alum & mag hydroxide-simeth (MAALOX/MYLANTA) 200-200-20 MG/5ML suspension 30 mL  30 mL Oral Q4H PRN Rosario Adie, Juel Burrow, FNP      . chlordiazePOXIDE (LIBRIUM) capsule 25 mg  25 mg Oral QID PRN Antonieta Pert, Callahan      . folic acid (FOLVITE) tablet 1 mg  1 mg Oral Daily Antonieta Pert, Callahan   1 mg at 07/31/19 0755  . gabapentin (NEURONTIN) capsule 200 mg  200 mg Oral TID Antonieta Pert, Callahan   200 mg at 07/31/19 1139  . hydrOXYzine (ATARAX/VISTARIL) tablet 25 mg  25 mg Oral TID PRN Maryagnes Amos, FNP   25 mg at 07/30/19 2106  . magnesium hydroxide (MILK OF MAGNESIA) suspension 30 mL  30 mL Oral Daily PRN Starkes-Perry, Juel Burrow, FNP      . nicotine (NICODERM CQ - dosed in mg/24 hours) patch 21 mg  21 mg Transdermal Daily Antonieta Pert, Callahan   21 mg at 07/31/19 1140  . thiamine (VITAMIN B-1) tablet 100 mg  100 mg Oral Daily Antonieta Pert, Callahan   100 mg at 07/31/19 0755  . traZODone (DESYREL) tablet 50 mg  50 mg Oral QHS PRN Antonieta Pert, Callahan   50 mg at 07/30/19 2106   Lab Results:  Results for orders placed or performed during the hospital encounter of 07/29/19 (from the past 48 hour(s))  Hemoglobin A1c     Status: None   Collection Time: 07/31/19  6:35 AM  Result Value Ref Range   Hgb A1c MFr Bld 5.4 4.8 - 5.6 %    Comment: (NOTE) Pre diabetes:          5.7%-6.4% Diabetes:              >6.4% Glycemic control for   <7.0% adults with diabetes    Mean Plasma Glucose 108.28 mg/dL    Comment: Performed at [facility], [address]  [place], [place] [address]  Lipid panel     Status: None   Collection Time: 07/31/19  6:35 AM  Result Value Ref Range   Cholesterol 121 0 - 200 mg/dL   Triglycerides 604 <540 mg/dL   HDL 42 >98 mg/dL   Total CHOL/HDL Ratio 2.9 RATIO   VLDL 20 0 - 40 mg/dL   LDL Cholesterol  59 0 - 99 mg/dL    Comment:        Total Cholesterol/HDL:CHD Risk Coronary Heart Disease Risk Table                     Men   Women  1/2 Average Risk   3.4   3.3  Average Risk       5.0   4.4  2 X Average Risk   9.6   7.1  3 X Average Risk  23.4   11.0        Use the calculated Patient Ratio above and the CHD Risk Table to determine the patient's CHD Risk.        ATP III CLASSIFICATION (LDL):  <100     mg/dL   Optimal  [id number]  mg/dL   Near or Above                    Optimal  130-159  mg/dL   Borderline  [id number]  mg/dL   High  >811>190     mg/dL   Very High Performed at [facility], [address] [place], [place] [address]   TSH     Status: None   Collection Time: 07/31/19  6:35 AM  Result Value Ref Range   TSH 0.442 0.350 - 4.500 uIU/mL    Comment: Performed by a 3rd Generation assay with a functional sensitivity of <=0.01 uIU/mL. Performed at [facility], [address] [place], [place] [address]    Blood Alcohol level:  Lab Results  Component Value Date   [facility] <10 07/28/2019   ETH <10 02/02/2019   Metabolic Disorder Labs: Lab Results  Component Value Date   HGBA1C 5.4 07/31/2019   MPG 108.28 07/31/2019   No results found for: PROLACTIN Lab Results  Component Value Date   CHOL 121 07/31/2019   TRIG 101 07/31/2019   HDL 42 07/31/2019   CHOLHDL 2.9 07/31/2019   VLDL 20 07/31/2019   LDLCALC 59 07/31/2019   Physical Findings: AIMS: Facial and Oral Movements Muscles of Facial Expression: None, normal Lips and Perioral Area: None, normal Jaw: None, normal Tongue: None, normal,Extremity Movements Upper (arms, wrists, hands, fingers): None, normal Lower  (legs, knees, ankles, toes): None, normal, Trunk Movements Neck, shoulders, hips: None, normal, Overall Severity Severity of abnormal movements (highest score from questions above): None, normal Incapacitation due to abnormal movements: None, normal Patient's awareness of abnormal movements (rate only patient's report): No Awareness, Dental Status Current problems with teeth and/or dentures?: No Does patient usually wear dentures?: No  CIWA:  CIWA-Ar Total: 2 COWS:  COWS Total Score: 2  Musculoskeletal: Strength & Muscle Tone: within normal limits Gait & Station: normal Patient leans: N/A  Psychiatric Specialty Exam: Physical Exam  Nursing note and vitals reviewed. Constitutional: He is oriented to person, place, and time. He appears well-developed.  Neck: Normal range of motion.  Cardiovascular: Normal rate.  Respiratory: Effort normal.  Genitourinary:    Genitourinary Comments: Deferred   Musculoskeletal: Normal range of motion.  Neurological: He is alert and oriented to person, place, and time.  Skin: Skin is warm.    Review of Systems  Constitutional: Negative for chills and fever.  Respiratory: Negative for cough, shortness of breath and wheezing.   Cardiovascular: Negative for chest pain and palpitations.  Gastrointestinal: Negative for nausea and vomiting.  Neurological: Negative for dizziness and headaches.  Psychiatric/Behavioral: Positive for depression and substance abuse (Hx.  Amphetamine use disorder). Negative for suicidal ideas.    Blood pressure 104/76, pulse 92, temperature 98.2 F (36.8 C), temperature source Oral, resp. rate 16, height 6\' 2"  (1.88 m), weight 72.1 kg.Body mass index is 20.41 kg/m.  General Appearance: Casual  Eye Contact:  Fair  Speech:  Clear and Coherent and Normal Rate  Volume:  Decreased  Mood:  Depressed  Affect:  Congruent and Flat  Thought Process:  Coherent and Descriptions of Associations: Intact  Orientation:  Full (Time,  Place, and Person)  Thought Content:  Rumination  Suicidal Thoughts:  Denies any thoughts, plans or intent  Homicidal Thoughts:  Denies  Memory:  Immediate;   Good Recent;   Good Remote;   Good  Judgement:  Fair  Insight:  Fair  Psychomotor Activity:  Normal  Concentration:  Concentration: Good and Attention Span: Good  Recall:  Good  Fund of Knowledge:  Good  Language:  Good  Akathisia:  Negative  Handed:  Right  AIMS (if indicated):     Assets:  Communication Skills Desire for Improvement  ADL's:  Intact  Cognition:  WNL  Sleep:  Number of Hours: 4.25   Treatment Plan Summary: Daily contact with patient to assess and evaluate symptoms and progress in treatment.  Substance withdrawal symptoms.    - Continue the librium capsule 25 mg  po Qid prn.    - Continue folic acid 1 mg po Q daily for Folate replacement.    - Continue Thiamine 100 mg po Q daily for thiamine replacement.  Depression.     - Initiated Fluoxetine 20 mg po daily.  Agitation.     - Continue Gabapentin 200 mg po tid.  Anxiety.    - Continue Vistaril 25 mg po tid prn.  Nicotine withdrawal.    - Continue Nicotine patch 21 mg topically Q 24 hours.  Patient to attend & participate in the group sessions. Discharge disposition in progress.  Insomnia.     - Increased Trazodone 100 mg po Q hs routinely.     Lindell Spar, NP, PMHNP, FNP-BC. 07/31/2019, 12:49 PM

## 2019-08-01 MED ORDER — ADULT MULTIVITAMIN W/MINERALS CH
1.0000 | ORAL_TABLET | Freq: Every day | ORAL | Status: DC
  Administered 2019-08-01 – 2019-08-03 (×3): 1 via ORAL
  Filled 2019-08-01 (×4): qty 1

## 2019-08-01 MED ORDER — ENSURE ENLIVE PO LIQD
237.0000 mL | ORAL | Status: DC
  Administered 2019-08-01 – 2019-08-02 (×2): 237 mL via ORAL

## 2019-08-01 MED ORDER — GABAPENTIN 300 MG PO CAPS
300.0000 mg | ORAL_CAPSULE | Freq: Three times a day (TID) | ORAL | Status: DC
  Administered 2019-08-01 – 2019-08-02 (×3): 300 mg via ORAL
  Filled 2019-08-01 (×6): qty 1

## 2019-08-01 MED ORDER — QUETIAPINE FUMARATE 200 MG PO TABS
200.0000 mg | ORAL_TABLET | Freq: Every day | ORAL | Status: DC
  Administered 2019-08-01 – 2019-08-02 (×2): 200 mg via ORAL
  Filled 2019-08-01 (×3): qty 1

## 2019-08-01 NOTE — [facility] Group Notes (Signed)
LCSW Group Therapy Note 08/01/2019 12:25 PM  Type of Therapy and Topic: Group Therapy: Overcoming Obstacles  Participation Level: Did Not Attend  Description of Group:  In this group patients will be encouraged to explore what they see as obstacles to their own wellness and recovery. They will be guided to discuss their thoughts, feelings, and behaviors related to these obstacles. The group will process together ways to cope with barriers, with attention given to specific choices patients can make. Each patient will be challenged to identify changes they are motivated to make in order to overcome their obstacles. This group will be process-oriented, with patients participating in exploration of their own experiences as well as giving and receiving support and challenge from other group members.  Therapeutic Goals: 1. Patient will identify personal and current obstacles as they relate to admission. 2. Patient will identify barriers that currently interfere with their wellness or overcoming obstacles.  3. Patient will identify feelings, thought process and behaviors related to these barriers. 4. Patient will identify two changes they are willing to make to overcome these obstacles:   Summary of Patient Progress  Invited, chose not to attend.    Therapeutic Modalities:  Cognitive Behavioral Therapy Solution Focused Therapy Motivational Interviewing Relapse Prevention Therapy   Theresa Duty Clinical Social Worker

## 2019-08-01 NOTE — Progress Notes (Signed)
CSW and patient discussed referrals and discharge planning. Patient is homeless in [place], [place] and reports he has alienated his supports.  He would like to be referred to a residential substance use treatment program. We discussed referrals for [facility] (and their long waitlist), he states he would not be able to transport himself to [place], if accepted to [facility], so we will not pursue this referral.  We also discussed [facility], CSW attempted to call to inquire about bed availability, with no answer. CSW provided contact information to patient and encouraged him to call himself.  Patient's back up plan is to discharge to [facility] in [place], [place] and follow up with [facility] for outpatient. Patient states he will need CSW to fax over a copy of his negative COVID labs to "[place]" at the shelter.  Stephanie Acre, LCSW-A Clinical Social Worker

## 2019-08-01 NOTE — Progress Notes (Signed)
NUTRITION ASSESSMENT RD working remotely.   Pt identified as at risk on the Malnutrition Screen Tool  INTERVENTION: - will order Ensure Enlive once/day, each supplement provides 350 kcal and 20 grams of protein. - will order daily multivitamin with minerals. - continue to encourage PO intakes.    NUTRITION DIAGNOSIS: Unintentional weight loss related to sub-optimal intake as evidenced by pt report.   Goal: Pt to meet >/= 90% of their estimated nutrition needs.  Monitor:  PO intake  Assessment:  Patient admitted for SI. Patient reported severe depression and notes indicate patient has hx of long-standing psychiatric history and polysubstance use/abuse.   Per chart review, patient reported drinking 12 pack of beer and taking methamphetamines as often as he could. He reported SI with SA x2 in the past. He reported poor sleep PTA.   Current weight is 159 lb and PTA the most recent weight was on 02/04/19 when he weighed 179 lb. This indicates 20 lb weight loss (11% body weight) in ~6 months; significant for time frame.    54 y.o. male  Height: Ht Readings from Last 1 Encounters:  07/29/19 6\' 2"  (1.88 m)    Weight: Wt Readings from Last 1 Encounters:  07/29/19 72.1 kg    Weight Hx: Wt Readings from Last 10 Encounters:  07/29/19 72.1 kg  07/28/19 72.6 kg  02/04/19 81.6 kg  02/02/19 81.6 kg  12/21/11 90.7 kg    BMI:  Body mass index is 20.41 kg/m. Pt meets criteria for normal weight based on current BMI.  Estimated Nutritional Needs: Kcal: 25-30 kcal/kg Protein: > 1 gram protein/kg Fluid: 1 ml/kcal  Diet Order:  Diet Order            Diet regular Room service appropriate? Yes; Fluid consistency: Thin  Diet effective now             Pt is also offered choice of unit snacks mid-morning and mid-afternoon.  Pt is eating as desired.   Lab results and medications reviewed.     Anthony Matin, MS, RD, LDN, [facility] Inpatient Clinical Dietitian Pager #  [phone] After hours/weekend pager # [phone]

## 2019-08-01 NOTE — Progress Notes (Signed)
Spiritual care group on grief and loss facilitated by chaplain Coda Mathey  Group Goal:  Support / Education around grief and loss Members engage in facilitated group support and psycho-social education.  Group Description:  Following introductions and group rules, group members engaged in facilitated group dialog and support around topic of loss, with particular support around experiences of loss in their lives. Group Identified types of loss (relationships / self / things) and identified patterns, circumstances, and changes that precipitate losses. Reflected on thoughts / feelings around loss, normalized grief responses, and recognized variety in grief experience. Patient Progress: Did not attend  

## 2019-08-01 NOTE — Tx Team (Signed)
Interdisciplinary Treatment and Diagnostic Plan Update  08/01/2019 Time of Session: 9:00am Anthony CleaverJeffrey L Knick MRN: [id number]  Principal Diagnosis: MDD (major depressive disorder), recurrent episode, severe (HCC)  Secondary Diagnoses: Principal Problem:   MDD (major depressive disorder), recurrent episode, severe (HCC) Active Problems:   Polysubstance dependence (HCC)   Current Medications:  Current Facility-Administered Medications  Medication Dose Route Frequency Provider Last Rate Last Dose  . acetaminophen (TYLENOL) tablet 650 mg  650 mg Oral Q6H PRN Anthony Callahan, Takia S, FNP      . alum & mag hydroxide-simeth (MAALOX/MYLANTA) 200-200-20 MG/5ML suspension 30 mL  30 mL Oral Q4H PRN Rosario AdieStarkes-Perry, Anthony Burrowakia S, FNP      . chlordiazePOXIDE (LIBRIUM) capsule 25 mg  25 mg Oral QID PRN Anthony Callahan, Anthony Lawson, MD      . feeding supplement (ENSURE ENLIVE) (ENSURE ENLIVE) liquid 237 mL  237 mL Oral Q24H Anthony Callahan, Brian, MD      . FLUoxetine (PROZAC) capsule 20 mg  20 mg Oral Daily Anthony Callahan, Agnes I, NP   20 mg at 08/01/19 [id number]  . folic acid (FOLVITE) tablet 1 mg  1 mg Oral Daily Anthony Callahan, Anthony Lawson, MD   1 mg at 08/01/19 [id number]  . gabapentin (NEURONTIN) capsule 200 mg  200 mg Oral TID Anthony Callahan, Anthony Lawson, MD   200 mg at 08/01/19 [id number]  . hydrOXYzine (ATARAX/VISTARIL) tablet 25 mg  25 mg Oral TID PRN Anthony Callahan, Takia S, FNP   25 mg at 07/31/19 2127  . magnesium hydroxide (MILK OF MAGNESIA) suspension 30 mL  30 mL Oral Daily PRN Anthony Callahan, Takia S, FNP      . multivitamin with minerals tablet 1 tablet  1 tablet Oral Daily Anthony Callahan, Brian, MD      . nicotine (NICODERM CQ - dosed in mg/24 hours) patch 21 mg  21 mg Transdermal Daily Anthony Callahan, Anthony Lawson, MD   21 mg at 08/01/19 [id number]  . QUEtiapine (SEROQUEL) tablet 100 mg  100 mg Oral QHS Anthony Callahan, Jason A, NP   100 mg at 07/31/19 2128  . thiamine (VITAMIN B-1) tablet 100 mg  100 mg Oral Daily Anthony Callahan, Anthony Lawson, MD   100 mg at 08/01/19 [id number]   PTA  Medications: Medications Prior to Admission  Medication Sig Dispense Refill Last Dose  . gabapentin (NEURONTIN) 600 MG tablet Take 1 tablet (600 mg total) by mouth 3 (three) times daily. 90 tablet 1     Patient Stressors: Financial difficulties Occupational concerns Substance abuse  Patient Strengths: Active sense of humor Capable of independent living Work skills  Treatment Modalities: Medication Management, Group therapy, Case management,  1 to 1 session with clinician, Psychoeducation, Recreational therapy.   Physician Treatment Plan for Primary Diagnosis: MDD (major depressive disorder), recurrent episode, severe (HCC) Long Term Goal(Callahan): Improvement in symptoms so as ready for discharge Improvement in symptoms so as ready for discharge   Short Term Goals: Ability to identify changes in lifestyle to reduce recurrence of condition will improve Ability to verbalize feelings will improve Ability to demonstrate self-control will improve Ability to identify and develop effective coping behaviors will improve Compliance with prescribed medications will improve Ability to identify triggers associated with substance abuse/mental health issues will improve  Medication Management: Evaluate patient'Callahan response, side effects, and tolerance of medication regimen.  Therapeutic Interventions: 1 to 1 sessions, Unit Group sessions and Medication administration.  Evaluation of Outcomes: Progressing  Physician Treatment Plan for Secondary Diagnosis: Principal Problem:   MDD (major depressive disorder), recurrent episode, severe (HCC) Active Problems:  Polysubstance dependence ([name])  Long Term Goal(Callahan): Improvement in symptoms so as ready for discharge Improvement in symptoms so as ready for discharge   Short Term Goals: Ability to identify changes in lifestyle to reduce recurrence of condition will improve Ability to verbalize feelings will improve Ability to demonstrate self-control will  improve Ability to identify and develop effective coping behaviors will improve Compliance with prescribed medications will improve Ability to identify triggers associated with substance abuse/mental health issues will improve     Medication Management: Evaluate patient'Callahan response, side effects, and tolerance of medication regimen.  Therapeutic Interventions: 1 to 1 sessions, Unit Group sessions and Medication administration.  Evaluation of Outcomes: Progressing   RN Treatment Plan for Primary Diagnosis: MDD (major depressive disorder), recurrent episode, severe (Anthony Callahan) Long Term Goal(Callahan): Knowledge of disease and therapeutic regimen to maintain health will improve  Short Term Goals: Ability to verbalize feelings will improve, Ability to identify and develop effective coping behaviors will improve and Compliance with prescribed medications will improve  Medication Management: RN will administer medications as ordered by provider, will assess and evaluate patient'Callahan response and provide education to patient for prescribed medication. RN will report any adverse and/or side effects to prescribing provider.  Therapeutic Interventions: 1 on 1 counseling sessions, Psychoeducation, Medication administration, Evaluate responses to treatment, Monitor vital signs and CBGs as ordered, Perform/monitor CIWA, COWS, AIMS and Fall Risk screenings as ordered, Perform wound care treatments as ordered.  Evaluation of Outcomes: Progressing   LCSW Treatment Plan for Primary Diagnosis: MDD (major depressive disorder), recurrent episode, severe ([name]) Long Term Goal(Callahan): Safe transition to appropriate next level of care at discharge, Engage patient in therapeutic group addressing interpersonal concerns.  Short Term Goals: Engage patient in aftercare planning with referrals and resources, Increase social support, Increase emotional regulation, Identify triggers associated with mental health/substance abuse issues and  Increase skills for wellness and recovery  Therapeutic Interventions: Assess for all discharge needs, 1 to 1 time with Social worker, Explore available resources and support systems, Assess for adequacy in community support network, Educate family and significant other(Callahan) on suicide prevention, Complete Psychosocial Assessment, Interpersonal group therapy.  Evaluation of Outcomes: Progressing   Progress in Treatment: Attending groups: No. Participating in groups: No. Taking medication as prescribed: Yes. Toleration medication: Yes. Family/Significant other contact made: No, will contact:  supports if consents ar granted. Patient understands diagnosis: Yes. Discussing patient identified problems/goals with staff: No. Medical problems stabilized or resolved: Yes. Denies suicidal/homicidal ideation: Yes. Issues/concerns per patient self-inventory: Yes.  New problem(Callahan) identified: Yes, Describe:  homelessness, limited supports  New Short Term/Long Term Goal(Callahan): detox, medication management for mood stabilization; elimination of SI thoughts; development of comprehensive mental wellness/sobriety plan.  Patient Goals:  Refused to come to treatment team.  Discharge Plan or Barriers: CSW assessing for appropriate referrals.   Reason for Continuation of Hospitalization: Anxiety Depression Withdrawal symptoms  Estimated Length of Stay: 1-2 days  Attendees: Patient: Anthony Callahan  08/01/2019 10:12 AM  Physician: Queen Blossom 08/01/2019 10:12 AM  Nursing: Benjamine Mola, RN 08/01/2019 10:12 AM  RN Care Manager: 08/01/2019 10:12 AM  Social Worker: Stephanie Acre, [place] 08/01/2019 10:12 AM  Recreational Therapist:  08/01/2019 10:12 AM  Other: Harriett Sine, NP 08/01/2019 10:12 AM  Other:  08/01/2019 10:12 AM  Other: 08/01/2019 10:12 AM    Scribe for Treatment Team: Joellen Jersey, LCSWA 08/01/2019 10:12 AM
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
## 2019-08-01 NOTE — Progress Notes (Signed)
[facility] MD Progress Note  08/01/2019 10:08 AM Anthony Callahan  MRN:  [id number] Subjective: Patient is a 53 year old male with a longstanding past psychiatric history significant for polysubstance dependence (most recently alcohol and methamphetamines) who presented to the [facility] emergency department on 07/28/2019 with suicidal ideation.  Objective: Patient is seen and examined.  Patient is a 53 year old male with the above-stated past psychiatric history who is seen in follow-up.  He is essentially unchanged.  His main concern is getting into residential treatment.  We discussed again the fact that it may be a 3-week waiting list.  His vital signs are stable, he is afebrile.  He did refuse vital signs this morning, and refused to attend treatment team.  Nursing notes reflect that he slept 4.25 hours last night.  His most recent CIWA was only 2.  His TSH came back at 0.442.  Principal Problem: MDD (major depressive disorder), recurrent episode, severe ([place]) Diagnosis: Principal Problem:   MDD (major depressive disorder), recurrent episode, severe ([facility]) Active Problems:   Polysubstance dependence ([place])  Total Time spent with patient: 15 minutes  Past Psychiatric History: See admission H&P  Past Medical History: History reviewed. No pertinent past medical history. History reviewed. No pertinent surgical history. Family History: History reviewed. No pertinent family history. Family Psychiatric  History: See admission H&P Social History:  Social History   Substance and Sexual Activity  Alcohol Use Yes   Comment: 12 pk/day     Social History   Substance and Sexual Activity  Drug Use Yes  . Types: Methamphetamines   Comment: meth last use was x 3-4 hours ago    Social History   Socioeconomic History  . Marital status: Legally Separated    Spouse name: Not on file  . Number of children: Not on file  . Years of education: Not on file  . Highest education level: Not on file   Occupational History  . Not on file  Social Needs  . Financial resource strain: Not on file  . Food insecurity    Worry: Not on file    Inability: Not on file  . Transportation needs    Medical: Not on file    Non-medical: Not on file  Tobacco Use  . Smoking status: Current Every Day Smoker    Packs/day: 1.00    Types: Cigarettes  . Smokeless tobacco: Never Used  Substance and Sexual Activity  . Alcohol use: Yes    Comment: 12 pk/day  . Drug use: Yes    Types: Methamphetamines    Comment: meth last use was x 3-4 hours ago  . Sexual activity: Not on file  Lifestyle  . Physical activity    Days per week: Not on file    Minutes per session: Not on file  . Stress: Not on file  Relationships  . Social Herbalist on phone: Not on file    Gets together: Not on file    Attends religious service: Not on file    Active member of club or organization: Not on file    Attends meetings of clubs or organizations: Not on file    Relationship status: Not on file  Other Topics Concern  . Not on file  Social History Narrative  . Not on file   Additional Social History:    Pain Medications: See MAR Prescriptions: See MAR Over the Counter: See MAR History of alcohol / drug use?: Yes Longest period of sobriety (when/how long): unkwon  Negative Consequences of Use: Financial                    Sleep: Fair  Appetite:  Fair  Current Medications: Current Facility-Administered Medications  Medication Dose Route Frequency Provider Last Rate Last Dose  . acetaminophen (TYLENOL) tablet 650 mg  650 mg Oral Q6H PRN Maryagnes Amos, FNP      . alum & mag hydroxide-simeth (MAALOX/MYLANTA) 200-200-20 MG/5ML suspension 30 mL  30 mL Oral Q4H PRN Rosario Adie, Juel Burrow, FNP      . chlordiazePOXIDE (LIBRIUM) capsule 25 mg  25 mg Oral QID PRN Antonieta Pert, MD      . feeding supplement (ENSURE ENLIVE) (ENSURE ENLIVE) liquid 237 mL  237 mL Oral Q24H Malvin Johns, MD       . FLUoxetine (PROZAC) capsule 20 mg  20 mg Oral Daily Armandina Stammer I, NP   20 mg at 08/01/19 8250  . folic acid (FOLVITE) tablet 1 mg  1 mg Oral Daily Antonieta Pert, MD   1 mg at 08/01/19 0370  . gabapentin (NEURONTIN) capsule 200 mg  200 mg Oral TID Antonieta Pert, MD   200 mg at 08/01/19 4888  . hydrOXYzine (ATARAX/VISTARIL) tablet 25 mg  25 mg Oral TID PRN Maryagnes Amos, FNP   25 mg at 07/31/19 2127  . magnesium hydroxide (MILK OF MAGNESIA) suspension 30 mL  30 mL Oral Daily PRN Maryagnes Amos, FNP      . multivitamin with minerals tablet 1 tablet  1 tablet Oral Daily Malvin Johns, MD      . nicotine (NICODERM CQ - dosed in mg/24 hours) patch 21 mg  21 mg Transdermal Daily Antonieta Pert, MD   21 mg at 08/01/19 9169  . QUEtiapine (SEROQUEL) tablet 100 mg  100 mg Oral QHS Nira Conn A, NP   100 mg at 07/31/19 2128  . thiamine (VITAMIN B-1) tablet 100 mg  100 mg Oral Daily Antonieta Pert, MD   100 mg at 08/01/19 4503    Lab Results:  Results for orders placed or performed during the hospital encounter of 07/29/19 (from the past 48 hour(s))  Hemoglobin A1c     Status: None   Collection Time: 07/31/19  6:35 AM  Result Value Ref Range   Hgb A1c MFr Bld 5.4 4.8 - 5.6 %    Comment: (NOTE) Pre diabetes:          5.7%-6.4% Diabetes:              >6.4% Glycemic control for   <7.0% adults with diabetes    Mean Plasma Glucose 108.28 mg/dL    Comment: Performed at [facility], [address] [place], [place] [address]  Lipid panel     Status: None   Collection Time: 07/31/19  6:35 AM  Result Value Ref Range   Cholesterol 121 0 - 200 mg/dL   Triglycerides 003 <491 mg/dL   HDL 42 >79 mg/dL   Total CHOL/HDL Ratio 2.9 RATIO   VLDL 20 0 - 40 mg/dL   LDL Cholesterol 59 0 - 99 mg/dL    Comment:        Total Cholesterol/HDL:CHD Risk Coronary Heart Disease Risk Table                     Men   Women  1/2 Average Risk   3.4   3.3  Average Risk  5.0   4.4  2 X Average Risk   9.6   7.1  3 X Average Risk  23.4   11.0        Use the calculated Patient Ratio above and the CHD Risk Table to determine the patient's CHD Risk.        ATP III CLASSIFICATION (LDL):  <100     mg/dL   Optimal  [id number]  mg/dL   Near or Above                    Optimal  130-159  mg/dL   Borderline  [id number]  mg/dL   High  >811>190     mg/dL   Very High Performed at [facility], [address] [place], [place] [address]   TSH     Status: None   Collection Time: 07/31/19  6:35 AM  Result Value Ref Range   TSH 0.442 0.350 - 4.500 uIU/mL    Comment: Performed by a 3rd Generation assay with a functional sensitivity of <=0.01 uIU/mL. Performed at [facility], [address] [place], [place] [address]     Blood Alcohol level:  Lab Results  Component Value Date   [facility] 07/[facility]   ETH <10 02/02/2019    Metabolic Disorder Labs: Lab Results  Component Value Date   HGBA1C 5.4 07/31/2019   MPG 108.28 07/31/2019   No results found for: PROLACTIN Lab Results  Component Value Date   CHOL 121 07/31/2019   TRIG 101 07/31/2019   HDL 42 07/31/2019   CHOLHDL 2.9 07/31/2019   VLDL 20 07/31/2019   LDLCALC 59 07/31/2019    Physical Findings: AIMS: Facial and Oral Movements Muscles of Facial Expression: None, normal Lips and Perioral Area: None, normal Jaw: None, normal Tongue: None, normal,Extremity Movements Upper (arms, wrists, hands, fingers): None, normal Lower (legs, knees, ankles, toes): None, normal, Trunk Movements Neck, shoulders, hips: None, normal, Overall Severity Severity of abnormal movements (highest score from questions above): None, normal Incapacitation due to abnormal movements: None, normal Patient's awareness of abnormal movements (rate only patient's report): No Awareness, Dental Status Current problems with teeth and/or dentures?: No Does patient usually wear dentures?: No  CIWA:   CIWA-Ar Total: 2 COWS:  COWS Total Score: 2  Musculoskeletal: Strength & Muscle Tone: within normal limits Gait & Station: normal Patient leans: N/A  Psychiatric Specialty Exam: Physical Exam  Constitutional: He is oriented to person, place, and time. He appears well-developed and well-nourished.  HENT:  Head: Normocephalic and atraumatic.  Respiratory: Effort normal.  Neurological: He is alert and oriented to person, place, and time.    ROS  Blood pressure 104/76, pulse 92, temperature 98.2 F (36.8 C), temperature source Oral, resp. rate 16, height 6\' 2"  (1.88 m), weight 72.1 kg.Body mass index is 20.41 kg/m.  General Appearance: Casual  Eye Contact:  Fair  Speech:  Normal Rate  Volume:  Normal  Mood:  Anxious and Irritable  Affect:  Congruent  Thought Process:  Coherent and Descriptions of Associations: Circumstantial  Orientation:  Full (Time, Place, and Person)  Thought Content:  Logical  Suicidal Thoughts:  No  Homicidal Thoughts:  No  Memory:  Immediate;   Fair Recent;   Fair Remote;   Fair  Judgement:  Impaired  Insight:  Lacking  Psychomotor Activity:  Normal  Concentration:  Concentration: Fair and Attention Span: Fair  Recall:  [employer]  Fund of Knowledge:  Fair  Language:  Fair  Akathisia:  Negative  Handed:  Right  AIMS (if indicated):     Assets:  Desire for Improvement Resilience  ADL's:  Intact  Cognition:  WNL  Sleep:  Number of Hours: 6.25     Treatment Plan Summary: Daily contact with patient to assess and evaluate symptoms and progress in treatment, Medication management and Plan : Patient is seen and examined.  Patient is a 53 year old male with the above-stated past psychiatric history who is seen in follow-up.   Diagnosis: #1 polysubstance dependence, #2 methamphetamine withdrawal, #3 elevated liver function enzymes, #4 insomnia  Patient is seen in follow-up.  He is essentially unchanged.  No change in his Prozac, but I will increase his  gabapentin to 300 mg p.o. 3 times daily for anxiety, and as well increase his Seroquel to 200 mg p.o. nightly for insomnia.  Hopefully it will decrease some of his irritability as well.  He is seeking residential treatment, but I suspect that he will have to be discharged prior to any bed availability.  He has not shown any overt symptoms of alcohol withdrawal, so I will stop the Librium today.  I will repeat his liver function enzymes tomorrow morning. 1.  Continue fluoxetine 20 mg p.o. daily for mood and anxiety. 2.  Continue folic acid 1 mg p.o. daily for nutritional supplementation. 3.  Increase gabapentin to 300 mg p.o. 3 times daily for anxiety and mood stability. 4.  Continue hydroxyzine 25 mg p.o. 3 times daily as needed anxiety. 5.  Continue multivitamin with minerals 1 tablet p.o. daily for nutritional supplementation. 6.  Increase Seroquel to 200 mg p.o. nightly for mood stability and insomnia. 7.  Continue thiamine 100 mg p.o. daily for nutritional supplementation. 8.  Stop Librium. 9.  Repeat liver function enzymes in a.m. tomorrow. 10.  Disposition planning-in progress.  Antonieta PertGreg Lawson Clary, MD 08/01/2019, 10:08 AM

## 2019-08-01 NOTE — Progress Notes (Signed)
Recreation Therapy Notes  Date:  8.31.20 Time: 0930 Location: 300 Hall Dayroom  Group Topic: Stress Management  Goal Area(s) Addresses:  Patient will identify positive stress management techniques. Patient will identify benefits of using stress management post d/c.  Intervention: Stress Management  Activity :  Progressive Muscle Relaxation.  LRT introduced the stress management technique of PMR.  LRT read a script that allowed patients to tense each muscle group individually and then release it.  Patients were to follow along as the script was read to engage in activity.  Education:  Stress Management, Discharge Planning.   Education Outcome: Acknowledges Education  Clinical Observations/Feedback:  Pt did not attend group.    Darthy Manganelli, LRT/CTRS         Kasyn Stouffer A 08/01/2019 10:50 AM 

## 2019-08-01 NOTE — [facility] Suicide Risk Assessment (Signed)
[place] INPATIENT:  Family/Significant Other Suicide Prevention Education  Suicide Prevention Education:  Patient Refusal for Family/Significant Other Suicide Prevention Education: The patient Anthony Callahan has refused to provide written consent for family/significant other to be provided Family/Significant Other Suicide Prevention Education during admission and/or prior to discharge.  Physician notified.  Joellen Jersey 08/01/2019, 1:52 PM

## 2019-08-01 NOTE — Care Management (Signed)
CMA sent referral to [facility] and [facility].   CMA will follow up with facilities.    CMA has notified CSW, [place].      Stillman Buenger Care Management Assistant  Email:[email or web address] Office: [phone]

## 2019-08-01 NOTE — Progress Notes (Signed)
Patient states that he had a pretty good day overall but did not go into further detail. His goal for tomorrow is to have a good telephone call for housing. He states that he has an appointment scheduled for 9:00am.

## 2019-08-01 NOTE — Progress Notes (Signed)
DAR NOTE: Patient presents with anxious affect and depressed mood.  Denies suicidal thoughts, pain, auditory and visual hallucinations.  Described energy level as low and concentration as poor.  Stayed in his room for majority of this shift.  Maintained on routine safety checks.  Medications given as prescribed.  Support and encouragement offered as needed.  Offered no complaint.  Patient is safe on the unit.

## 2019-08-01 NOTE — Progress Notes (Signed)
CSW received a callback from "Angel" at [employer] in [place]. She states there is shelter availability and that the patient is able to come if he brings a printed copy of a negative COVID screening.  Stephanie Acre, LCSW-A Clinical Social Worker

## 2019-08-02 NOTE — Progress Notes (Signed)
Patient successfully completed his phone interview with [facility]. He has been accepted for tomorrow, [facility] will be able to pick up at the patient between 11:30am-1:30pm.   There are two contingencies, these were discussed with the patient and Dr.Cobos. The patient is not able to come to [facility] while taking Gabapentin, patient was agreeable to discontinue the medication.  Otherwise, patient will need to have a 14 day supply of all medications, except for Seroquel- he will need a 28 day supply. [place] will not accept patient without the medications.  Stephanie Acre, LCSW-A Clinical Social Worker

## 2019-08-02 NOTE — Progress Notes (Signed)
DAR NOTE: Patient presents with anxious affect and mood.  Denies suicidal thoughts, pain, auditory and visual hallucinations.  Rates depression at 4, hopelessness at 0, and anxiety at 4.  Maintained on routine safety checks.  Medications given as prescribed.  Support and encouragement offered as needed.  States goal for today is "I have a phone interview for a place to go."  Patient stayed in his room for majority of this shift. Patient is safe on and off the unit.  Offered no complaint.

## 2019-08-02 NOTE — Progress Notes (Signed)
Recreation Therapy Notes  Animal-Assisted Activity (AAA) Program Checklist/Progress Notes Patient Eligibility Criteria Checklist & Daily Group note for Rec Tx Intervention  Date: 9.1.20 Time: 30 Location: [place]   AAA/T Program Assumption of Risk Form signed by Teacher, music or Parent Legal Guardian YES   Patient is free of allergies or sever asthma YES   Patient reports no fear of animals  YES   Patient reports no history of cruelty to animals  YES   Patient understands his/her participation is voluntary  YES   Patient washes hands before animal contact  YES   Patient washes hands after animal contact  YES   Behavioral Response: Engaged  Education: Contractor, Appropriate Animal Interaction   Education Outcome: Acknowledges understanding/In group clarification offered/Needs additional education.   Clinical Observations/Feedback:  Patient attended and participated in activity.    Victorino Sparrow, LRT/CTRS         Ria Comment, Turki Tapanes A 08/02/2019 3:15 PM

## 2019-08-02 NOTE — BHH Group Notes (Signed)
[place] Group Notes:  (Nursing/MHT/Case Management/Adjunct)  Date:  08/02/2019  Time:  1:00 PM  Type of Therapy:  Nurse Education  Participation Level:  Did Not Attend  Summary of Progress/Problems:Pt's were asked what their knowledge base surrounding mindfulness was at the beginning of group. Pt's provided examples that they used or times when they had practiced mindfulness in the past. Some pt's were even unaware that they were practicing mindfulness/grounding techniques. Pt's were encouraged to try these techniques throughout the day, and express any achievements/opportunities in wrap up group tonight. Pt did not attend group   Baron Sane 08/02/2019, 3:19 PM

## 2019-08-02 NOTE — Progress Notes (Signed)
[facility] MD Progress Note  08/02/2019 10:23 AM YOSHIAKI KREUSER  MRN:  [id number] Subjective: patient reports he is feeling better and currently does not endorse symptoms of WDL. Denies suicidal ideations, presents future oriented and focused on disposition planning options. He is hoping to go to [facility], a residential rehab setting. Currently denies medication side effects.   Objective: I have met with patient and reviewed case with treatment team/reviewed notes .  Patient is a 53 year old male with history of alcohol and methamphetamine dependencies , presented for chest pain and suicidal ideations.  Today patient reports he is feeling significantly better than he did on admission and at this time does not endorse significant depression and denies suicidal ideations. Presents future oriented, with a stated plan of going to rehab setting [facility]) at discharge. Denies medication side effects.  Behavior on unit calm and in good control.    Principal Problem:  Substance Induced Mood Disorder Diagnosis: Principal Problem:   MDD (major depressive disorder), recurrent episode, severe (HCC) Active Problems:   Polysubstance dependence ([place])  Total Time spent with patient: 20 minutes  Past Psychiatric History: See admission H&P  Past Medical History: History reviewed. No pertinent past medical history. History reviewed. No pertinent surgical history. Family History: History reviewed. No pertinent family history. Family Psychiatric  History: See admission H&P Social History:  Social History   Substance and Sexual Activity  Alcohol Use Yes   Comment: 12 pk/day     Social History   Substance and Sexual Activity  Drug Use Yes  . Types: Methamphetamines   Comment: meth last use was x 3-4 hours ago    Social History   Socioeconomic History  . Marital status: Legally Separated    Spouse name: Not on file  . Number of children: Not on file  . Years of education: Not on  file  . Highest education level: Not on file  Occupational History  . Not on file  Social Needs  . Financial resource strain: Not on file  . Food insecurity    Worry: Not on file    Inability: Not on file  . Transportation needs    Medical: Not on file    Non-medical: Not on file  Tobacco Use  . Smoking status: Current Every Day Smoker    Packs/day: 1.00    Types: Cigarettes  . Smokeless tobacco: Never Used  Substance and Sexual Activity  . Alcohol use: Yes    Comment: 12 pk/day  . Drug use: Yes    Types: Methamphetamines    Comment: meth last use was x 3-4 hours ago  . Sexual activity: Not on file  Lifestyle  . Physical activity    Days per week: Not on file    Minutes per session: Not on file  . Stress: Not on file  Relationships  . Social Herbalist on phone: Not on file    Gets together: Not on file    Attends religious service: Not on file    Active member of club or organization: Not on file    Attends meetings of clubs or organizations: Not on file    Relationship status: Not on file  Other Topics Concern  . Not on file  Social History Narrative  . Not on file   Additional Social History:    Pain Medications: See MAR Prescriptions: See MAR Over the Counter: See MAR History of alcohol / drug use?: Yes Longest period of  sobriety (when/how long): unkwon Negative Consequences of Use: Financial  Sleep: improving   Appetite:  improving   Current Medications: Current Facility-Administered Medications  Medication Dose Route Frequency Provider Last Rate Last Dose  . acetaminophen (TYLENOL) tablet 650 mg  650 mg Oral Q6H PRN Suella Broad, FNP      . alum & mag hydroxide-simeth (MAALOX/MYLANTA) 200-200-20 MG/5ML suspension 30 mL  30 mL Oral Q4H PRN Suella Broad, FNP   30 mL at 08/01/19 1145  . feeding supplement (ENSURE ENLIVE) (ENSURE ENLIVE) liquid 237 mL  237 mL Oral Q24H Johnn Hai, MD   237 mL at 08/01/19 2106  . FLUoxetine  (PROZAC) capsule 20 mg  20 mg Oral Daily Lindell Spar I, NP   20 mg at 08/02/19 0825  . folic acid (FOLVITE) tablet 1 mg  1 mg Oral Daily Sharma Covert, MD   1 mg at 08/02/19 6629  . gabapentin (NEURONTIN) capsule 300 mg  300 mg Oral TID Sharma Covert, MD   300 mg at 08/02/19 0825  . hydrOXYzine (ATARAX/VISTARIL) tablet 25 mg  25 mg Oral TID PRN Suella Broad, FNP   25 mg at 08/01/19 2105  . magnesium hydroxide (MILK OF MAGNESIA) suspension 30 mL  30 mL Oral Daily PRN Suella Broad, FNP      . multivitamin with minerals tablet 1 tablet  1 tablet Oral Daily Johnn Hai, MD   1 tablet at 08/02/19 0825  . nicotine (NICODERM CQ - dosed in mg/24 hours) patch 21 mg  21 mg Transdermal Daily Sharma Covert, MD   21 mg at 08/02/19 0826  . QUEtiapine (SEROQUEL) tablet 200 mg  200 mg Oral QHS Sharma Covert, MD   200 mg at 08/01/19 2105  . thiamine (VITAMIN B-1) tablet 100 mg  100 mg Oral Daily Sharma Covert, MD   100 mg at 08/02/19 0825    Lab Results:  No results found for this or any previous visit (from the past 48 hour(s)).  Blood Alcohol level:  Lab Results  Component Value Date   ETH <10 07/28/2019   ETH <10 47/65/4650    Metabolic Disorder Labs: Lab Results  Component Value Date   HGBA1C 5.4 07/31/2019   MPG 108.28 07/31/2019   No results found for: PROLACTIN Lab Results  Component Value Date   CHOL 121 07/31/2019   TRIG 101 07/31/2019   HDL 42 07/31/2019   CHOLHDL 2.9 07/31/2019   VLDL 20 07/31/2019   LDLCALC 59 07/31/2019    Physical Findings: AIMS: Facial and Oral Movements Muscles of Facial Expression: None, normal Lips and Perioral Area: None, normal Jaw: None, normal Tongue: None, normal,Extremity Movements Upper (arms, wrists, hands, fingers): None, normal Lower (legs, knees, ankles, toes): None, normal, Trunk Movements Neck, shoulders, hips: None, normal, Overall Severity Severity of abnormal movements (highest score from  questions above): None, normal Incapacitation due to abnormal movements: None, normal Patient's awareness of abnormal movements (rate only patient's report): No Awareness, Dental Status Current problems with teeth and/or dentures?: No Does patient usually wear dentures?: No  CIWA:  CIWA-Ar Total: 2 COWS:  COWS Total Score: 2  Musculoskeletal: Strength & Muscle Tone: within normal limits- no tremors, no diaphoresis Gait & Station: normal Patient leans: N/A  Psychiatric Specialty Exam: Physical Exam  Constitutional: He is oriented to person, place, and time. He appears well-developed and well-nourished.  HENT:  Head: Normocephalic and atraumatic.  Respiratory: Effort normal.  Neurological: He is alert  and oriented to person, place, and time.    ROS denies headache, no chest pain, no shortness of breath, no vomiting, no fever  Blood pressure 102/83, pulse 90, temperature (!) 97.5 F (36.4 C), temperature source Oral, resp. rate 16, height '6\' 2"'$  (1.88 m), weight 72.1 kg.Body mass index is 20.41 kg/m.  General Appearance: Well Groomed  Eye Contact:  Good  Speech:  Normal Rate  Volume:  Normal  Mood:  improving  Affect:  Appropriate  Thought Process:  Linear and Descriptions of Associations: Intact  Orientation:  Full (Time, Place, and Person)  Thought Content:  no hallucinations, no delusions, not internally preoccupied   Suicidal Thoughts:  No denies suicidal or self injurious ideations, denies homicidal or violent ideations  Homicidal Thoughts:  No  Memory:  Recent and remote grossly intact  Judgement:  Other:  improving   Insight:  improving   Psychomotor Activity:  Normal  Concentration:  Concentration: Good and Attention Span: Good  Recall:  Good  Fund of Knowledge:  Good  Language:  Good  Akathisia:  Negative  Handed:  Right  AIMS (if indicated):     Assets:  Desire for Improvement Resilience  ADL's:  Intact  Cognition:  WNL  Sleep:  Number of Hours: 6.5    Assessment - patient reports improving mood and currently denies significant depression. Denies SI and presents future oriented, wanting to go to a rehab setting at discharge. Currently not presenting with symptoms of ETOH WDL and vitals are stable   Treatment Plan Summary: Treatment Plan reviewed as below today 9/1  Encourage group and milieu participation Encourage efforts to work on sobriety and relapse prevention Continue fluoxetine 20 mg p.o. daily for mood and anxiety. Continue folic acid 1 mg p.o. daily for nutritional supplementation. Continue gabapentin to 300 mg p.o. 3 times daily for anxiety and mood stability. Continue hydroxyzine 25 mg p.o. 3 times daily as needed anxiety. Continue multivitamin with minerals 1 tablet p.o. daily for nutritional supplementation. Continue Seroquel  200 mg p.o. nightly for mood stability and insomnia. Continue thiamine 100 mg p.o. daily for nutritional supplementation. Treatment team working on disposition planning options- as above, patient wants to go to [facility] , a residential rehab .   Jenne Campus, MD 08/02/2019, 10:23 AM   Patient ID: Alma Friendly, male   DOB: [birth year], 81 y.o.   MRN: [id number]   Addendum 08/02/2019  Met with CSW, Baldo Ash and with patient. Patient has been accepted to [facility] as of tomorrow. They have informed patient and CSW that they  will not accept Neurontin as a medication he can be on while there. Patient reports he has been on Neurontin for anxiety in the past , was not taking it " for a long while" prior to this admission, where it was restarted . He is in agreement with discontinuing Neurontin at this time, in order to be accepted to above rehab setting. As had only recently been restarted does not currently need taper.  D/C Neurontin  F Cobos MD

## 2019-08-02 NOTE — Progress Notes (Signed)
Patient rated his day as an 8 out of a possible 10. He verbalized in group that he was accepted into a 28 day treatment program today. He feels that he will be discharged tomorrow.

## 2019-08-03 MED ORDER — NICOTINE 21 MG/24HR TD PT24: 21.0000 mg | MEDICATED_PATCH | Freq: Every day | TRANSDERMAL | 0 refills | Status: AC

## 2019-08-03 MED ORDER — HYDROXYZINE HCL 25 MG PO TABS: 25.0000 mg | ORAL_TABLET | Freq: Three times a day (TID) | ORAL | 0 refills | Status: AC | PRN

## 2019-08-03 MED ORDER — GABAPENTIN 600 MG PO TABS: 600.0000 mg | ORAL_TABLET | Freq: Three times a day (TID) | ORAL | 0 refills | Status: AC

## 2019-08-03 MED ORDER — GABAPENTIN 600 MG PO TABS
600.0000 mg | ORAL_TABLET | Freq: Three times a day (TID) | ORAL | Status: DC
  Filled 2019-08-03 (×3): qty 21

## 2019-08-03 MED ORDER — QUETIAPINE FUMARATE 200 MG PO TABS: 200.0000 mg | ORAL_TABLET | Freq: Every day | ORAL | 0 refills | Status: AC

## 2019-08-03 MED ORDER — FLUOXETINE HCL 20 MG PO CAPS: 20.0000 mg | ORAL_CAPSULE | Freq: Every day | ORAL | 0 refills | Status: AC

## 2019-08-03 NOTE — Progress Notes (Signed)
D:  Patient denied SI and HI, contracts for safety.  Denied A/V hallucinations.  Denied pain. A:  Medications administered per MD orders.  Emotional support and encouragement given patient. R:  Safety maintained with 15 minute checks.  

## 2019-08-03 NOTE — Progress Notes (Signed)
Discharge note  Patient verbalizes readiness for discharge. Follow up plan explained, AVS, Transition record and SRA given. Prescriptions and teaching provided. Belongings returned and signed for. Suicide safety plan completed and signed. Patient verbalizes understanding. Patient denies SI/HI and assures this Probation officer he will seek assistance should that change. Patient discharged to lobby where driver was waiting to take pt to [facility] .

## 2019-08-03 NOTE — Progress Notes (Signed)
D: Patient denies SI, HI or AVH. Patient presents as flat, depressed and minimal but cooperative.  Pt. Is hoping for discharge tomorrow and is accepted into a 28 day treatment program.  Pt. Denies any physical complaints.  A: Patient given emotional support from RN. Patient encouraged to come to staff with concerns and/or questions. Patient's medication routine continued. Patient's orders and plan of care reviewed.   R: Patient remains appropriate and cooperative. Will continue to monitor patient q15 minutes for safety.

## 2019-08-03 NOTE — Progress Notes (Signed)
  [facility] Adult Case Management Discharge Plan :  Will you be returning to the same living situation after discharge:  No. Going to residential treatment. At discharge, do you have transportation home?: Yes,  facility will pick up after 11am Do you have the ability to pay for your medications: No. Provided medication samples.  Release of information consent forms completed and in the chart. Letter on chart.  Patient to Follow up at: Follow-up [facility].   Specialty: Addiction Medicine Why: A referral for residential substance use treatment was made on your behalf. To follow up, call and ask to speak with "[facility]." Contact information: [place] [address] [place]. Go on 08/03/2019.   Why: You have been accepted for residential substance use treatment. The facility will provide transportation. Please bring your hospital discharge paperwork and medication samples.  Contact information: [address] [place], [place] [address] Phone: [phone]          Next level of care provider has access to [place] and Suicide Prevention discussed: Yes,  with patient.  Have you used any form of tobacco in the last 30 days? (Cigarettes, Smokeless Tobacco, Cigars, and/or Pipes): Yes  Has patient been referred to the Quitline?: Patient refused referral  Patient has been referred for addiction treatment: Yes  Joellen Jersey, [place] 08/03/2019, 9:23 AM
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
## 2019-08-03 NOTE — [facility] Suicide Risk Assessment (Signed)
[facility]   Principal Problem: MDD (major depressive disorder), recurrent episode, severe (HCC) Discharge Diagnoses: Principal Problem:   MDD (major depressive disorder), recurrent episode, severe (HCC) Active Problems:   Polysubstance dependence (HCC)   Total Time spent with patient: 30 minutes  Musculoskeletal: Strength & Muscle Tone: within normal limits Gait & Station: normal Patient leans: N/A  Psychiatric Specialty Exam: ROSno headache, no chest pain, no shortness of breath, no vomiting   Blood pressure 116/76, pulse 78, temperature 97.6 F (36.4 C), temperature source Oral, resp. rate 16, height 6\' 2"  (1.88 m), weight 72.1 kg.Body mass index is 20.41 kg/m.  General Appearance: Well Groomed  Patent attorneyye Contact::  Good  Speech:  Normal Rate409  Volume:  Normal  Mood:  reports mood " normal", denies depression  Affect:  Appropriate  Thought Process:  Linear and Descriptions of Associations: Intact  Orientation:  Full (Time, Place, and Person)  Thought Content:  no hallucinations, no delusions  Suicidal Thoughts:  No no suicidal or self injurious ideations  Homicidal Thoughts:  No  Memory:  recent and remote grossly intact   Judgement:  Other:  improving   Insight:  improving  Psychomotor Activity:  Normal  Concentration:  Good  Recall:  Good  Fund of Knowledge:Good  Language: Good  Akathisia:  Negative  Handed:  Right  AIMS (if indicated):     Assets:  Communication Skills Desire for Improvement Resilience  Sleep:  Number of Hours: 6.5  Cognition: WNL  ADL's:  Intact   Mental Status Per Nursing Assessment::   On Admission:  NA  Demographic Factors:  52, single, one adult daughter, employed  Loss Factors: Relapse/substance abuse   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 Behavior on unit calm and in good control, pleasant on approach. Denies medication side effects- currently on Seroquel/Prozac. ( Had also been started on Neurontin but was discontinued as Rehab Setting he is going to does not allow clients to be on it) . Side effects, including risk of weight gain, metabolic disturbances, NMS, TD on Seroquel and risk of sexual dysfunction on Prozac. 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 Have recommended that he seek outpatient care via ID or PCP for management .   Cognitive Features That Contribute To Risk:  No gross cognitive deficits noted upon discharge. Is alert , attentive, and oriented x 3   Suicide Risk:  Mild:  Suicidal ideation of limited frequency, intensity, duration, and specificity.  There are no identifiable plans, no associated intent, mild dysphoria and related symptoms, good self-control (both objective and subjective assessment), few other risk factors, and identifiable protective factors, including available and accessible social support.  Follow-up Information    [facility] Call.   Specialty: Addiction Medicine Why: A referral for residential substance use treatment was made on your behalf. To follow up, [facility] Contact information: [address] [place] [address] [phone]        [facility] Go on [facility].   Why: You have been accepted  for residential substance use treatment. The facility will provide transportation. Please bring your hospital discharge paperwork and medication samples.  Contact information: [address] [place], [place] [address] Phone:  [phone]          Plan Of Care/Follow-up recommendations:  Activity:  as tolerated Diet:  regular Tests:  NA Other:  See below  Patient will be going to [facility] , a residential rehab setting.   Jenne Campus, MD 08/03/2019, 10:37 AM

## 2020-02-26 IMAGING — DX DG CHEST 2V
2 series · 2 of 2 positions shown · non-contrast
Comparison: None.

CLINICAL DATA: Diffuse muscle ache and spasm

EXAM:
CHEST - 2 VIEW

[chest pa]
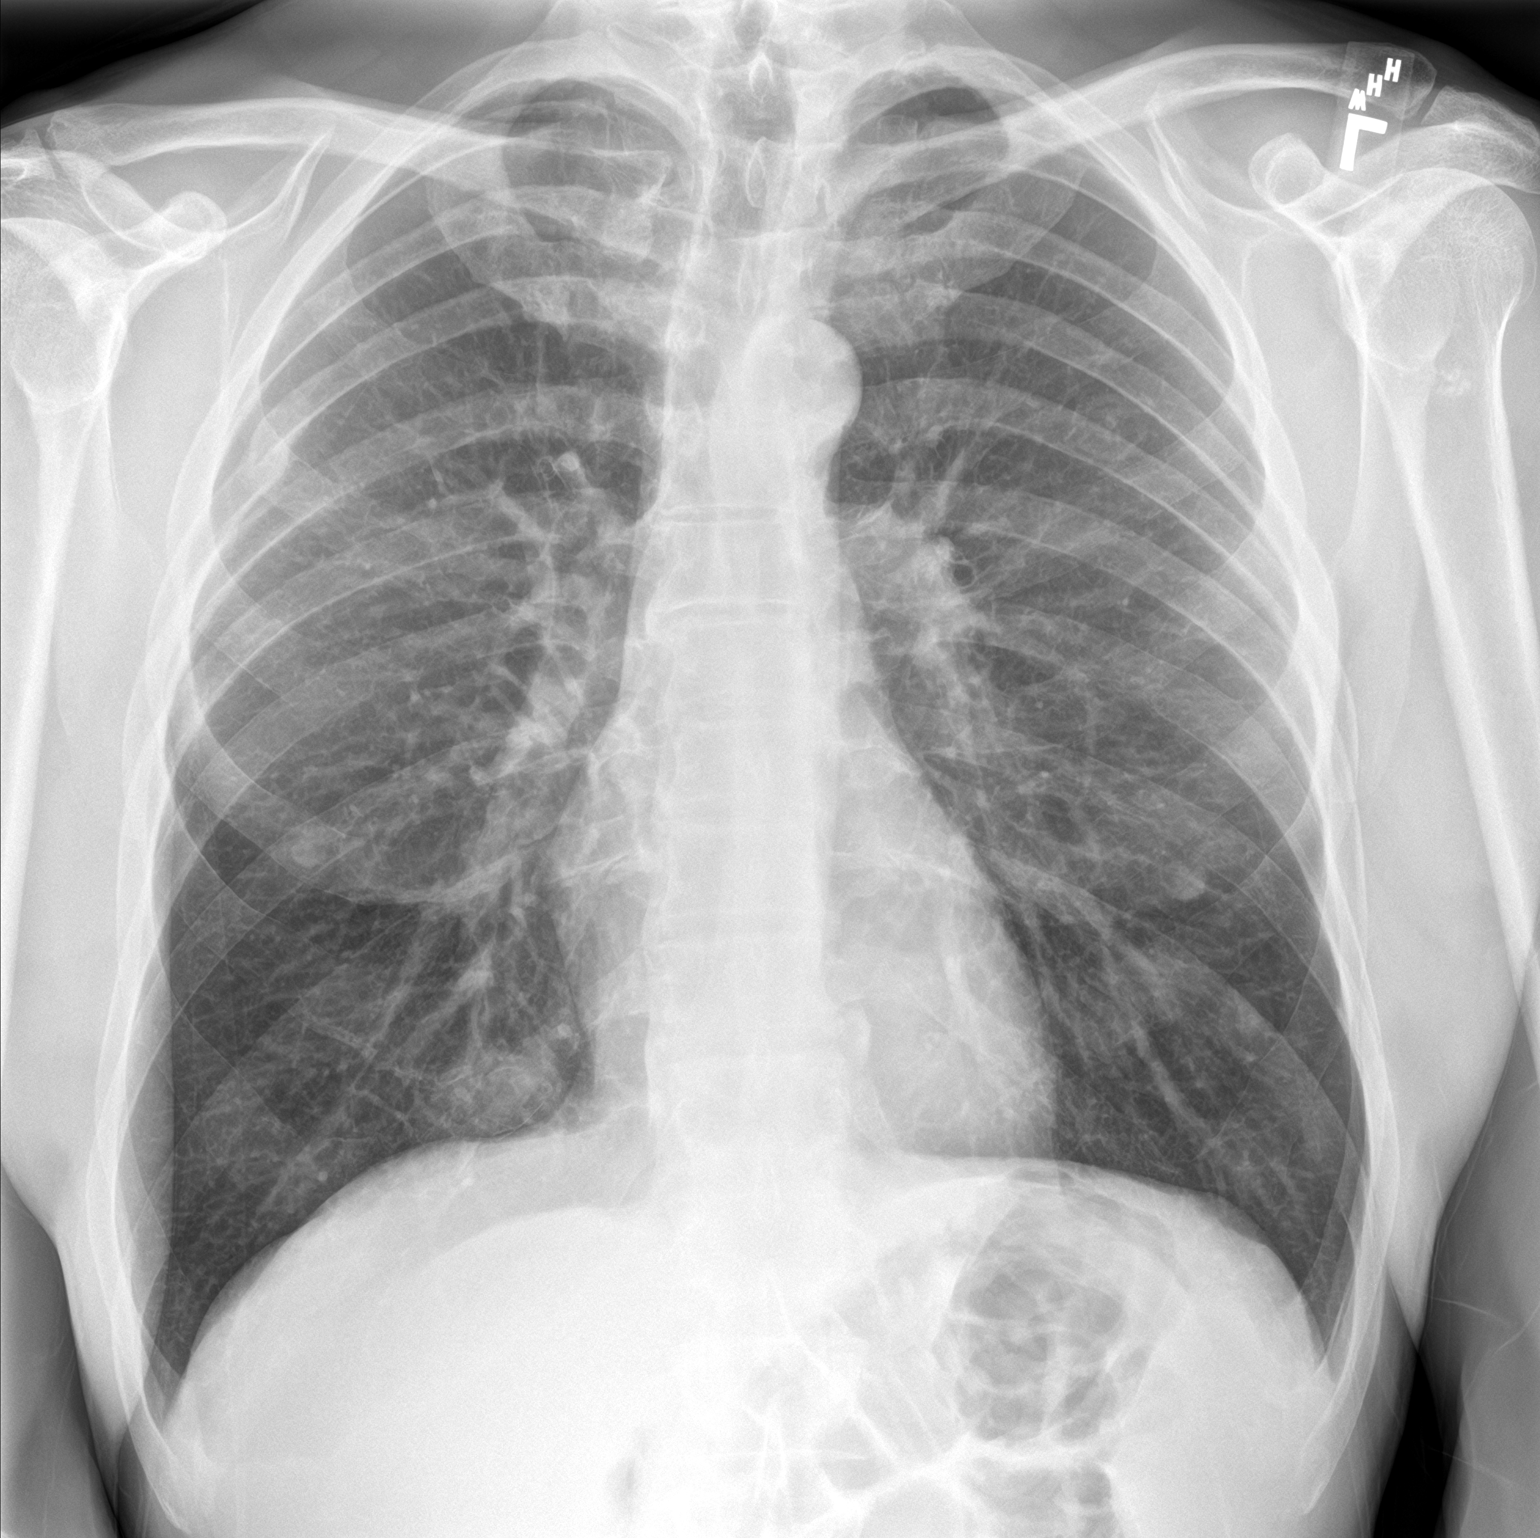

[chest lat]
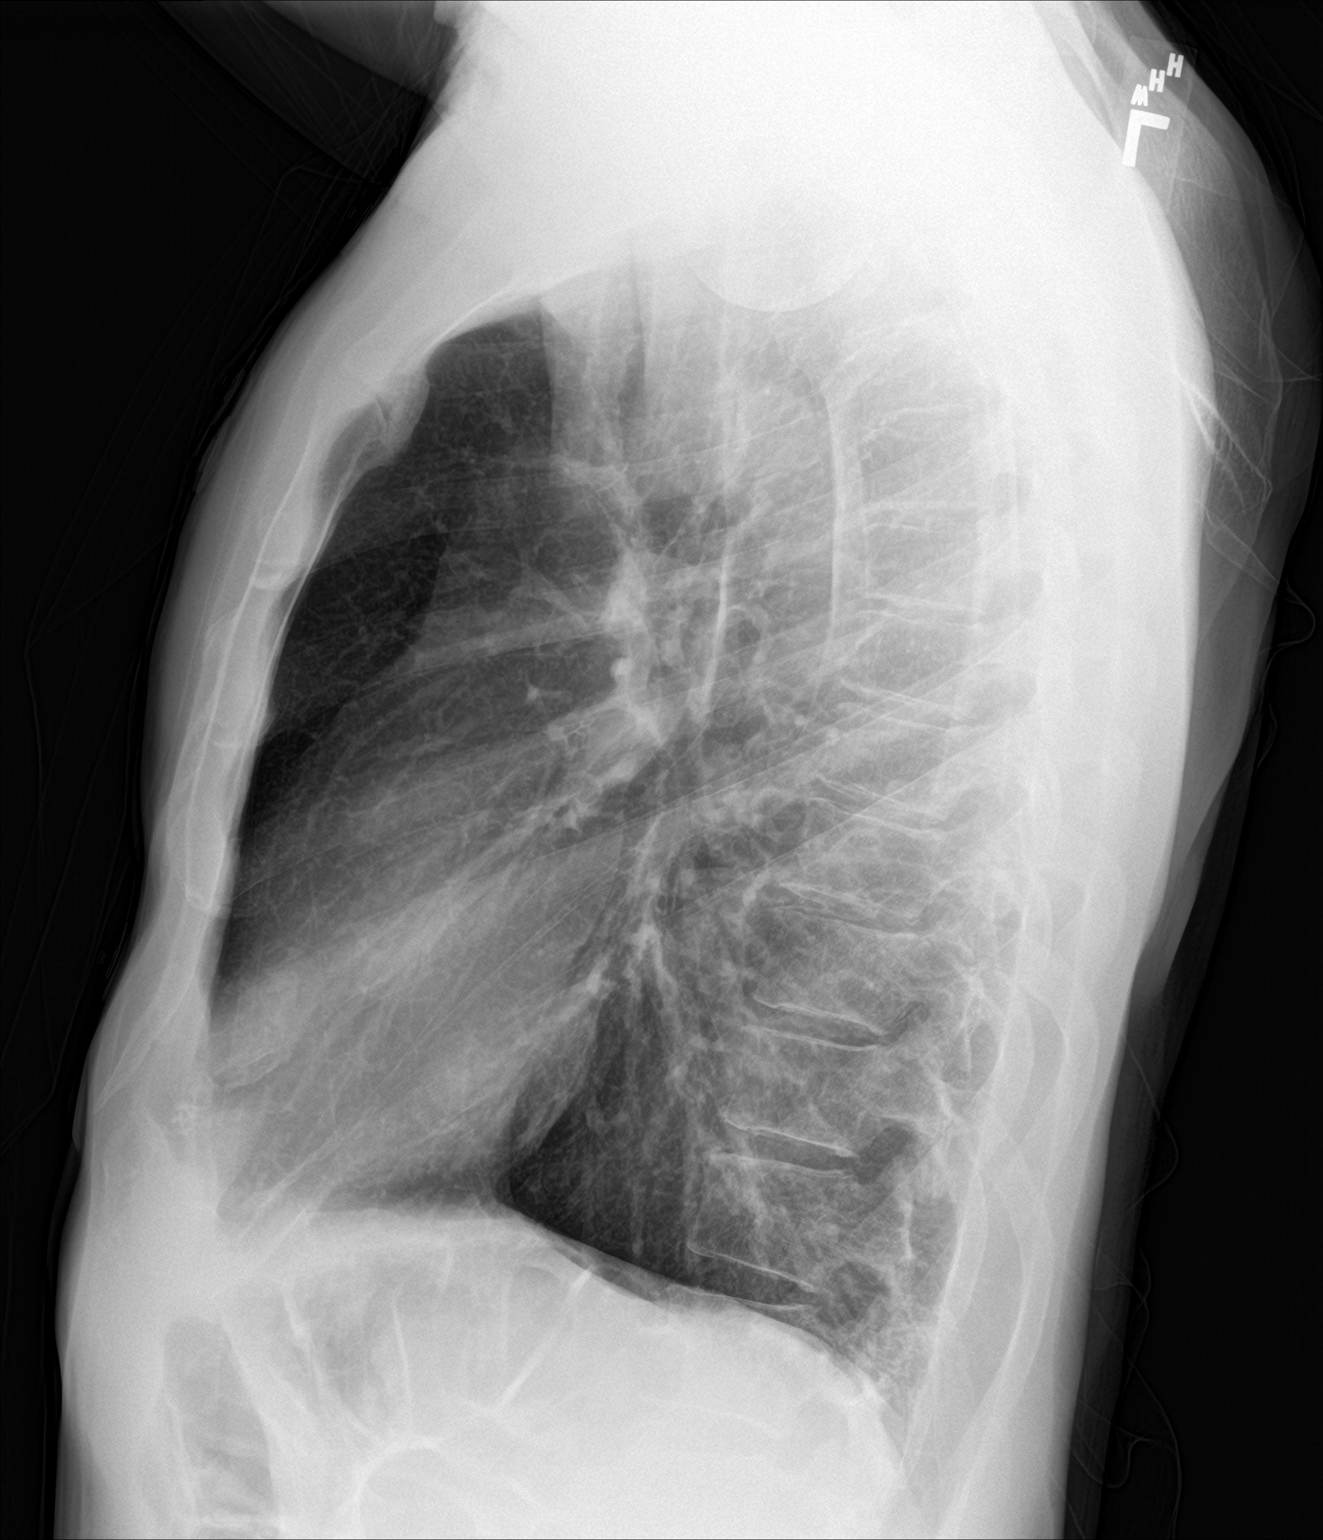

[2 of 2 positions shown; findings below may reference images not displayed]

FINDINGS: The heart size and mediastinal contours are within normal limits.
Mild pulmonary hyperinflation is noted. Both lungs are clear. Healed
fracture deformity of the right posterior fifth rib. Nodular
densities project over the mid to lower lung bilaterally at the same
level consistent with nipple shadows. Costochondral calcification
and cartilage off the anterior right sixth rib is believed to
account for ovoid density seen at the right lung base on the frontal
view. No mass is apparent on the lateral to account for this
finding.
IMPRESSION: Mild pulmonary hyperinflation.

## 2020-08-17 IMAGING — DX CHEST - 2 VIEW
2 series · 2 of 2 positions shown · non-contrast
Comparison: 02/05/2019

CLINICAL DATA: Chest pain

EXAM:
CHEST - 2 VIEW

[chest pa]
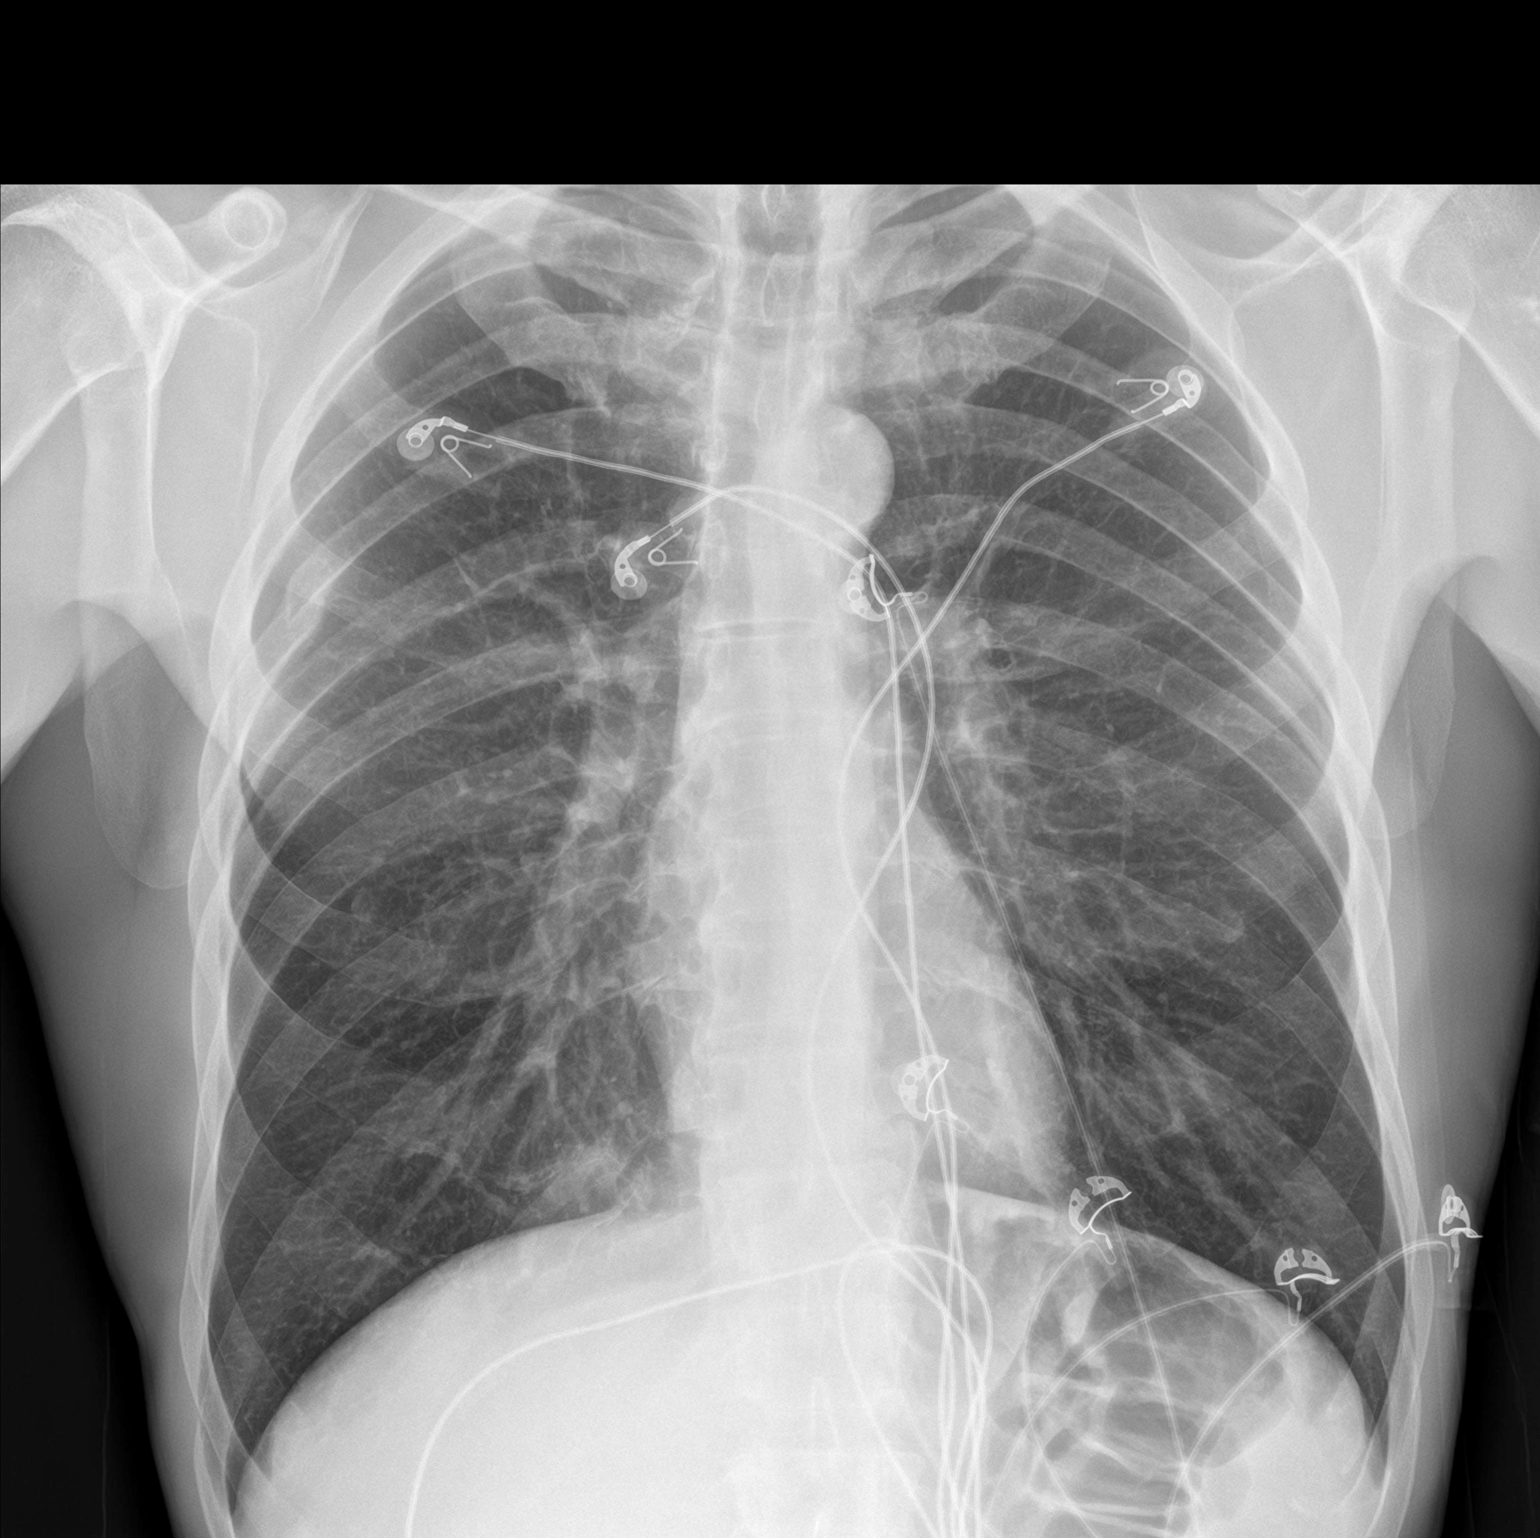

[chest lat]
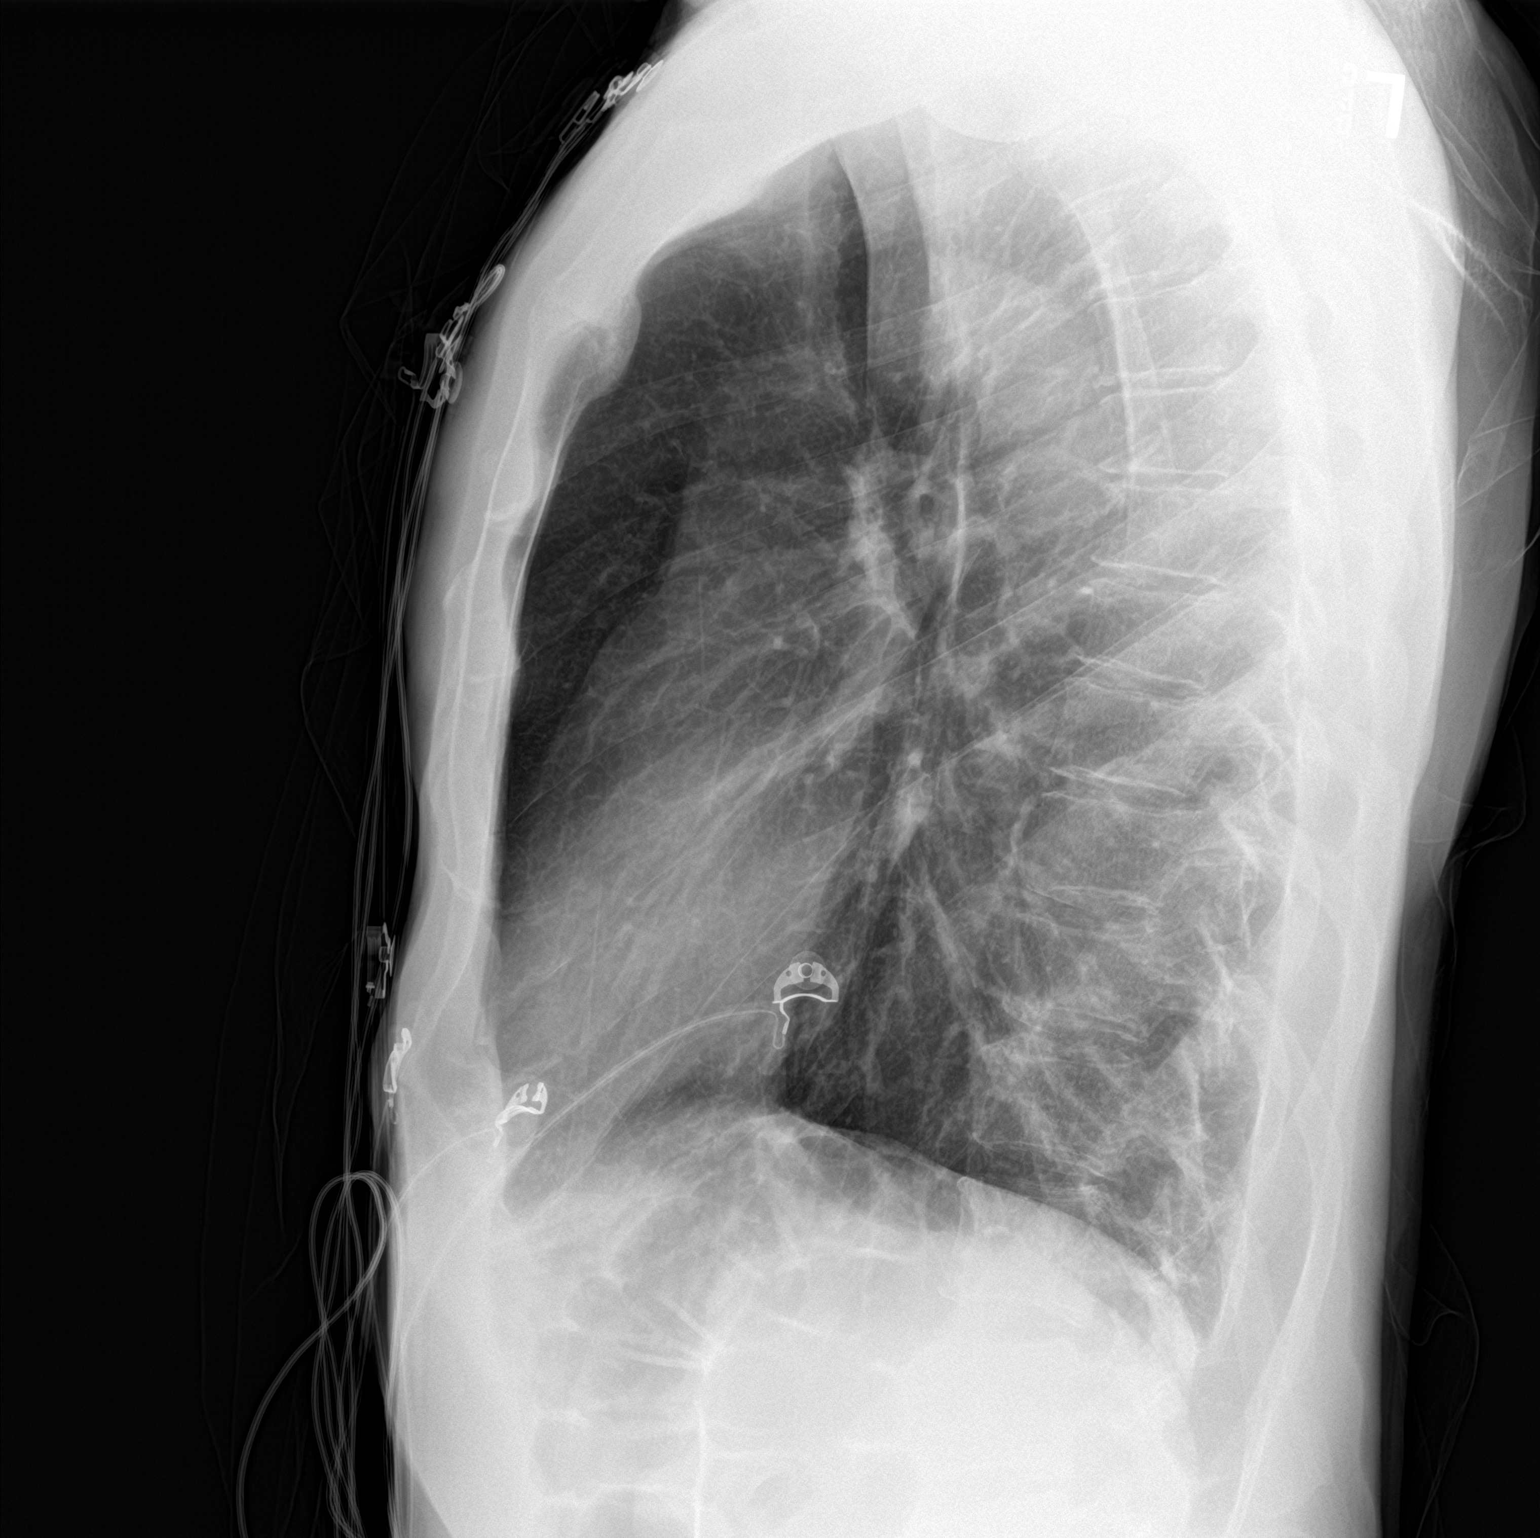

[2 of 2 positions shown; findings below may reference images not displayed]

FINDINGS: Hyperinflation without focal opacity, pleural effusion or
pneumothorax. Stable cardiomediastinal silhouette.
IMPRESSION: No active cardiopulmonary disease.  Pulmonary hyperinflation.
